# Patient Record
Sex: Male | Born: 1975 | Race: White | Hispanic: No | Marital: Married | Smoking: Former smoker
Health system: Southern US, Community
[De-identification: ages and names within clinical notes are randomized; demographics above are authoritative.]

## PROBLEM LIST (undated history)

## (undated) DIAGNOSIS — I10 Essential (primary) hypertension: Secondary | ICD-10-CM

## (undated) DIAGNOSIS — M792 Neuralgia and neuritis, unspecified: Secondary | ICD-10-CM

## (undated) DIAGNOSIS — J449 Chronic obstructive pulmonary disease, unspecified: Secondary | ICD-10-CM

## (undated) DIAGNOSIS — F329 Major depressive disorder, single episode, unspecified: Secondary | ICD-10-CM

## (undated) DIAGNOSIS — F419 Anxiety disorder, unspecified: Secondary | ICD-10-CM

## (undated) DIAGNOSIS — F32A Depression, unspecified: Secondary | ICD-10-CM

## (undated) DIAGNOSIS — M069 Rheumatoid arthritis, unspecified: Secondary | ICD-10-CM

## (undated) HISTORY — PX: WISDOM TOOTH EXTRACTION: SHX21

## (undated) HISTORY — DX: Anxiety disorder, unspecified: F41.9

## (undated) HISTORY — DX: Chronic obstructive pulmonary disease, unspecified: J44.9

## (undated) HISTORY — DX: Essential (primary) hypertension: I10

## (undated) HISTORY — DX: Rheumatoid arthritis, unspecified: M06.9

---

## 2004-06-24 ENCOUNTER — Ambulatory Visit (HOSPITAL_COMMUNITY): Admission: RE | Admit: 2004-06-24 | Discharge: 2004-06-24 | Payer: Self-pay | Admitting: Family Medicine

## 2007-10-02 ENCOUNTER — Inpatient Hospital Stay (HOSPITAL_COMMUNITY): Admission: EM | Admit: 2007-10-02 | Discharge: 2007-10-05 | Payer: Self-pay | Admitting: Emergency Medicine

## 2007-10-02 ENCOUNTER — Ambulatory Visit: Payer: Self-pay | Admitting: Internal Medicine

## 2007-10-12 ENCOUNTER — Ambulatory Visit: Payer: Self-pay | Admitting: *Deleted

## 2007-10-12 ENCOUNTER — Encounter (INDEPENDENT_AMBULATORY_CARE_PROVIDER_SITE_OTHER): Payer: Self-pay | Admitting: Internal Medicine

## 2007-10-12 DIAGNOSIS — D751 Secondary polycythemia: Secondary | ICD-10-CM

## 2007-10-12 DIAGNOSIS — F29 Unspecified psychosis not due to a substance or known physiological condition: Secondary | ICD-10-CM | POA: Insufficient documentation

## 2007-10-12 LAB — CONVERTED CEMR LAB
Albumin: 3.8 g/dL (ref 3.5–5.2)
CO2: 31 meq/L (ref 19–32)
Calcium: 9.6 mg/dL (ref 8.4–10.5)
Chloride: 98 meq/L (ref 96–112)
Glucose, Bld: 96 mg/dL (ref 70–99)
MCV: 102.8 fL — ABNORMAL HIGH (ref 78.0–100.0)
Potassium: 4.6 meq/L (ref 3.5–5.3)
RBC: 5.34 M/uL (ref 4.22–5.81)
Sodium: 137 meq/L (ref 135–145)
Total Protein: 6.3 g/dL (ref 6.0–8.3)
WBC: 6.3 10*3/uL (ref 4.0–10.5)

## 2007-10-13 DIAGNOSIS — E538 Deficiency of other specified B group vitamins: Secondary | ICD-10-CM

## 2007-11-04 ENCOUNTER — Telehealth: Payer: Self-pay | Admitting: *Deleted

## 2007-11-09 ENCOUNTER — Encounter (INDEPENDENT_AMBULATORY_CARE_PROVIDER_SITE_OTHER): Payer: Self-pay | Admitting: Internal Medicine

## 2007-11-09 ENCOUNTER — Ambulatory Visit: Payer: Self-pay | Admitting: Internal Medicine

## 2007-11-09 LAB — CONVERTED CEMR LAB
RBC: 4.7 M/uL (ref 4.22–5.81)
WBC: 10.3 10*3/uL (ref 4.0–10.5)

## 2010-07-02 NOTE — Discharge Summary (Signed)
NAMEJANARD, Ethan Norton NO.:  [id number]   MEDICAL RECORD NO.:  [id number]          PATIENT TYPE:  INP   LOCATION:  4702                         FACILITY:  [facility]   PHYSICIAN:  Mick Sell, MD DATE OF BIRTH:  1975-02-21   DATE OF ADMISSION:  10/02/2007  DATE OF DISCHARGE:  10/05/2007                               DISCHARGE SUMMARY   DISCHARGE DIAGNOSES:  Main:  Confusion/panic attacks.  Acute:  1. Erythrocytosis.  2. Ethanol abuse.  Chronic:  1. Tobacco abuse.  2. Possible chronic obstructive pulmonary disease.   CONDITION AT DISCHARGE:  Stable.   FOLLOWUP:  To include CBC to evaluate erythrocytosis and CMET to  evaluate elevated bilirubin.  The patient is to follow up with Dr.  Elvera Lennox at [facility], with an appointment scheduled for  October 12, 2007, at 2:50 p.m.   PROCEDURES:  The patient had a CT of the head without contrast on October 02, 2007, that showed a normal unenhanced cranial CT and no focal  abnormalities.  The patient had a chest x-ray on October 02, 2007, that  showed mild diffuse interstitial lung disease, which has progressed  since May 2006 and is likely directly related to the long-time smoking  history.  No acute cardiopulmonary disease.  The patient also had an MR  of the brain without contrast and an MRA of the head with contrast.  The  MRI of the brain without contrast on October 02, 2007, had the findings  of a negative intracranial exam and normal non-contrast MRI appearance  of the brain.  The MRA of the brain without contrast showed a negative  intracranial MRA.   CONSULTATIONS:  Antonietta Breach, MD, Psychiatry, was consulted.   ADMISSION HISTORY AND PHYSICAL:  The patient is a 35 year old male with  significant alcohol use, a family history of Huntington disease in  father, and smoking history who presents with 1 day of confusion/panic  attack/odd emotions that began at work on the day prior to admission,  resolved upon getting home later that night, and then woke him up around  2 a.m. on the day of admission.  Wife reports the patient acting  confused and talking in his sleep.  The patient reports infrequent dizzy  spells with vision closing in but no syncope.  He endorses diaphoresis  when he had the panic attack earlier.  He also has infrequent panic  attacks.  He says that he has had frequent feelings of helplessness and  sadness recently but no suicidal or homicidal ideations.  The patient  has had frequent restlessness over the last week.   PHYSICAL EXAMINATION:  VITAL SIGNS:  Temperature of 97.9, blood pressure  of 154/100, pulse of 116, respiratory rate of 18, and oxygen saturation  95% to 98% on room air.  GENERAL:  The patient is groggy, lying in bed, and slow to answer  questions.  For some of the history, his wife answered for him because  of his reluctance or inability to answer the questions.  HEENT:  Eyes:  Pupils equal, round, and reactive to light and  accommodation.  Extraocular  muscles intact, and his conjunctivae were  injected.  NECK:  No JVD.  No bruits.  RESPIRATORY:  Diffuse expiratory wheezes and rhonchi.  No crackles.  Decreased air movement.  CARDIOVASCULAR:  Regular rate and rhythm.  No murmurs, rubs, or gallops.  GI:  Soft.  Mild tenderness to palpation in the left lower quadrant.  Nondistended.  Positive normoactive bowel sounds.  EXTREMITIES:  No cyanosis, clubbing, or edema.  SKIN:  Less than 2-second capillary refill.  NEURO:  Cranial nerves II through XII intact.  5/5 bilateral lower and  upper extremity strength.  No asterixis.  Sensation to light touch  intact bilaterally distally and proximally.  Deep tendon reflexes 2/4  symmetrically, and cerebellar function intact.   LABORATORY DATA:  His admission BMET was sodium of 135, potassium of  4.0, chloride of 98, bicarbonate 25, BUN of 4, creatinine of 0.81, and  glucose of 100.  His liver function  tests were as follows:  Bilirubin of  2.7, awaiting fractionation still; alkaline phosphatase 85, AST of 25,  ALT of 14, total protein of 7.0, albumin of 4.1, and calcium of 9.7.  His CBC was as follows:  White cell count of 8.7, hemoglobin of 21.1,  hematocrit of 61.2, platelets of 143, ANC of 6.3, and MCV of 100.  His  ethanol level is less than 5.  His UA had a specific gravity of 1.004,  pH of 6.5, negative otherwise.  His UDS was negative.  His cardiac  enzymes x1 were negative.   HOSPITAL COURSE:  1. Confusion/panic attacks.  The patient has had infrequent panic      attacks over the last couple of weeks to months and was having one      earlier at work on the day prior to admission and then had some      confusion with odd emotions that occurred that night and so his      wife brought him to the emergency room.  In the ED, it was      difficult to elicit a history from the patient secondary to the      patient's inability or reluctance to answer questions.  Due to the      acute onset of his symptoms, a head CT without contrast was      performed, which was negative.  A followup MRI was also performed,      which was normal.  On the second day of admission, he began to have      improvement during the day.  The second night of admission,      however, the nurse noted possible hallucination that the patient      was having in the middle of the night, and the night float team was      called, which gave him 2 mg of Ativan which calmed him down and      seemed to quell the hallucinations.  Because of his behavior, the      psychiatrist Dr. Jeanie Sewer was consulted.  In his assessment,  he commented that there may be a multifactorial delirium that occurred  the night prior and that there is likely an underlying depression.  There could also be an anxiety disorder.  He recommended the patient  start on Celexa 10 mg for the first 4 days and then 20 mg after that as  his initial trail  dose for an anxiolytic.  He also recommended the use  of Xanax 0.5 mg to  1 mg for panic attacks if the patient did not drink  any alcohol.  The initial differential for the patient's behavior  included CVA, which was ruled out with the MRI; ethanol withdrawal  symptoms, which seem less likely given that he did not have any physical  signs of withdrawal, and early onset of Huntington disease given the  presence of this disease in his father and his own lack of genetic  testing.  Intoxication, infection, hypothyroidism, metabolic or  electrolyte derangements, or pheochromocytoma were also considered.  His  UDS was negative.  He did not have a leukocytosis and was afebrile, and  his TSH came back at 0.748.  Given the rest of his workup was normal, it  seems most likely that the patient's mental state does not have a  definitive organic etiology and most likely stems from a psychiatric  disorder.  An RPR and HIV were also ordered, which were nonreactive, and  the patient had a B12 level of 378.  A  methylmalonic acid level for the  patient is still pending.  1. Polycythemia.  The patient's initial CBC had a hemoglobin of 21.1      and a hematocrit of 61.2.  A followup CBC later that day had a      hemoglobin of 18.7 and a hematocrit of 54.9, on October 02, 2007.      He had received IV fluids in the interim between these 2 CBCs.  His      discharge hemoglobin was 17.4 and a hematocrit of 50.4.  He had      reported being slightly dehydrated during the day prior to      admission, and this may have contributed to his elevated hemoglobin      secondary to the decreased volume.  We performed a erythropoietin      level, which was low at 2.2.  Our initial differential for the      polycythemia included chronic hypoxia secondary to smoking,      polycythemia vera, other primary bone-marrow disorders, and      idiopathic erythrocytosis.  We will follow up on his polycythemia      as an outpatient  and consider further workup.  At this point, it      seems like the chronic hypoxia secondary to smoking along with      potential idiopathic erythrocytosis contributed to his persistently      elevated hemoglobin level.  His initial dehydration likely      contributed to the significantly elevated hemoglobin at      presentation of 21.1.  Given his erythrocytosis, we thought it      added to the risk of a potential CVA, which was another reason for      the MRI.  2. Elevated bilirubin.  His total bilirubin on admission was 2.7 on      October 02, 2007, and a followup bilirubin level was 2.5 later that      day on October 02, 2007.  Fractionation of the bilirubin had been      ordered but has not come back yet at this time.  We request that      repeat CMP with potential fractionation of the bilirubin be      repeated.  3. Ethanol abuse.  The patient reported that he drinks up to 6 beers      pretty much every day and had been doing so up until 2 days prior  to admission.  He denied ever having withdrawal seizures or      delirium.  However, according to his wife, he has experienced      withdrawal symptoms including tremors.  For this reason, he was put      on CIWA protocol for monitoring, but this did not result in      symptoms elevated enough to require a benzodiazepine.  He received      alcohol cessation counseling during his hospitalization, and it was      recommended that he discontinue alcohol use completely as this may      have been contributing to his chief complaint.  4. Depression. The patient reported feelings of helplessness and      sadness on admission but denied any homicidal or suicidal ideation.      He received a psychiatric evaluation from Dr. Jeanie Sewer, and we are      starting the patient on Celexa 10 mg for the first 4 days and then      20 mg afterwards as an outpatient.  5. Tobacco abuse.  The patient had quit smoking about a month ago but      has been  using smokeless inhalers with [facility] Original Smoke      Juice for the past few weeks.  He was put on a nicotine patch      during the hospitalization.  Tobacco cessation was encouraged      through counseling.  He was prescribed nicotine patches as an      outpatient, receiving the #21 patch for 7 days, the #14 patch for 7      days, and then the #7 patch for the last 7 days.  6. A family history of Huntington disease.  The patient is well aware      of his risk of having Huntington disease but has refused genetic      testing in the past.  He was encouraged to pursue this as an      outpatient.   DISCHARGE LABORATORIES:  His last CBC on October 05, 2007, had a  hemoglobin of 17.4, hematocrit of 50.4, MCV of 100.6, RDW of 14.5, and  platelet count of 132.  His last BMET had a sodium of 142, a potassium  of 3.5, chloride of 108, bicarbonate of 29, glucose of 97, creatinine of  0.66, and calcium of 8.1.  He also had a hemoglobin binding study on  October 03, 2007, which showed a total hemoglobin of 18.1, oxygen  saturation at 98.6, carboxyhemoglobin at 1.1, and methemoglobin of 0.9.   PENDING LABORATORIES:  The fractionation of bilirubin is pending at this  time.  The patient's methylmalonic acid level is also pending.      Linward Foster, MD  Electronically Signed      Mick Sell, MD  Electronically Signed   LW/MEDQ  D:  10/05/2007  T:  10/06/2007  Job:  [id number]   cc:   Carlus Pavlov, M.D.  Antonietta Breach, M.D.  Outpatient Clinic

## 2010-07-02 NOTE — Consult Note (Signed)
NAMECAYSON, KALB NO.:  [id number]   MEDICAL RECORD NO.:  [id number]          PATIENT TYPE:  INP   LOCATION:  4702                         FACILITY:  [facility]   PHYSICIAN:  Antonietta Breach, M.D.  DATE OF BIRTH:  19-Oct-1975   DATE OF CONSULTATION:  10/04/2007  DATE OF DISCHARGE:                                 CONSULTATION   REFERRING PHYSICIAN:  Mick Sell, MD   REASON FOR CONSULTATION:  Psychosis, anxiety, rule out delirium.   HISTORY OF PRESENT ILLNESS:  Mr. Cantera is a 35 year old male admitted  to the [facility] on October 02, 2007 due to confusion.   Mr. Geraci requested that his wife attend the session in order to help  with history.  Mr. Loomer describes a 4-week period of increased  anxiety over work and rental house.  Two weeks ago he began to drink  heavily, at least 6 beers a day.  He was having regular insomnia.  He  was not drinking or eating well (regarding nonalcoholic drinks).   He noticed that he began to have some unclear mental status.  His wife  noticed that he was talking out of his head at times over the past  approximate 7-10 days.   This became worse.  The patient became grossly confused, which forced  his wife to get him to the hospital.   Mr. Molstad has been receiving Ativan 2 mg q.6 h. p.r.n.  He had his  last alcohol 2 days prior to admission.   He was having paranoid delusions and clouding of consciousness.   On the day of the undersigned visiting, Mr. Deupree has intact  orientation.  He has intact memory function.  His thought process is  coherent.  He is not having any hallucinations or delusions.  He has no  thoughts of harming himself or others.   Mr. Overholser does describe ongoing excessive worry and feeling on edge.  He also describes short 5-10 minute periods over the past month,  where  he has had shortness of breath, palpitations and anxiety.  He has had  fear of other attacks returning.   He acknowledges that he has been using  alcohol to calm his anxiety.   PAST PSYCHIATRIC HISTORY:  Mr. Hanrahan does have a history of excessive  worry and feeling on edge.  He has never had a period of the panic  attacks such as the past 4 weeks.  He also has never had a period of  confusion and delusions as he has had this past week.   His wife states that today he has been his normal self again.   FAMILY PSYCHIATRIC HISTORY:  Mr. Voorhis father had Huntington's  disease and died 2 years ago.  He has also had another family death in  the past year.   SOCIAL HISTORY:  Mr. Leonhardt is married.  They have a mutually  supportive marriage, 2 children.  They both work for the [employer] on  different shifts.  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   Mr. Deason denies any illegal  drugs.  There is no abuse in the  marriage.  They live together in a home with their children.   PAST MEDICAL HISTORY:  Usual childhood illnesses.   MEDICATIONS:  The MAR is reviewed.  Mr. Zinni is on folic acid 1 mg  daily, B-1 100 mg daily, Ativan 2 mg q.6 h. p.r.n.   ALLERGIES:  __________.   LABORATORY DATA:  MRI without contrast of the brain, unremarkable.  Head  CT without contrast, unremarkable.   WBC 6.2, hemoglobin 17.9, platelet count 126, SGOT 21, SGPT 12, sodium  141, BUN 61, creatinine 0.67, glucose 112.   Folic acid, HIV, RPR, B12, TSH, urine drug screen and alcohol all  negative.   REVIEW OF SYSTEMS:  CONSTITUTIONAL:  HEAD, EYES, EARS, NOSE AND THROAT,  MOUTH, NEUROLOGIC, PSYCHIATRIC, CARDIOVASCULAR, RESPIRATORY,  GASTROINTESTINAL, GENITOURINARY, SKIN,  MUSCULOSKELETAL, HEMATOLOGIC  ,LYMPHATIC, ENDOCRINE, METABOLIC:  All unremarkable.   PHYSICAL EXAMINATION:  VITAL SIGNS:  Temperature 97.2, pulse 92,  respiratory rate 20, blood pressure 144/98, O2 saturation on room air  96%.  GENERAL APPEARANCE:  Mr. Hemann is alert.  His attention span is   within normal limits.  His eye contact is good.  He is oriented to all  spheres.  MOOD:  Slightly anxious.  AFFECT:  Mildly anxious.  CONCENTRATION:  Slightly decreased.  MEMORY:  Intact to immediate recent and remote except for some of the  confusion.  SPEECH:  Involves normal rate and prosody.  There is no dysarthria.  THOUGHT PROCESS:  Logical, coherent, goal-directed.  No looseness of  associations.  THOUGHT CONTENT:  No thoughts of harming himself.  No thoughts of  harming others.  No delusions, no hallucinations.  Fund of knowledge and  intelligence within normal limits.  Insight is intact.  Judgment is  intact.   ASSESSMENT:  AXIS I:  293.84.  Anxiety disorder, not otherwise  specified.  Rule out panic disorder without agoraphobia.  293.00.  Rule  out delirium, not otherwise specified.   Mr. Dom does appear to have had a multifactorial delirium which has  been possibly due to insomnia, combined with alcohol withdrawal. He also  states that he is had a cough with chills.   Alcohol withdrawal delirium as a single entity would not explain his  delirium due to the fact that the physiologic withdrawal symptoms and  signs have not been the usual severity that is involved in alcohol  withdrawal delirium.   AXIS II:  Deferred.  AXIS III:  See past medical history.  AXIS IV:  Occupational.  AXIS V:  55.   Mr. Gerding's delirium symptoms are currently clear.  However, by the  nature of delirium, they could return.   Would continue to follow his cough and general medical status as is  currently being done.   If his delirium symptoms return, would start Seroquel 25 to 50 mg  nightly.   The undersigned provided ego supportive psychotherapy and education,  including education with the wife.   The indications, alternatives and adverse effects of Celexa and Xanax  were discussed with the patient.  He understands and would like to think  about those medications.   The  undersigned also discussed cognitive behavioral therapy.  Again Mr.  Mccollam wants to consider these therapeutic tools but not yet start  them.   The undersigned also discussed the importance of no alcohol.  Mr.  Million is ambivalent about treatment at this point.  If he agrees,  would set him up  with a chemical dependency intensive outpatient program  that can involve relapse prevention skills and standard psychotherapy,  along with psychotropic medication management.   Regarding Celexa, if he agrees, would start Celexa at 10 mg q.a.m. and  then increase over the next 4 days to 20 mg q.a.m. as his initial trial  dose for antianxiety.   Under the conditions of strictly no alcohol, would utilize Xanax 0.5 to  1 mg t.i.d. p.r.n. panic.  No driving if drowsy.   Alcoholics Anonymous materials and meetings if Mr. Ansell is deciding  that he is willing.   Would continue folic acid 1 mg daily, thiamine 100 mg daily and a  multivitamin daily indefinitely.   Over the long term, cognitive behavioral therapy combined with Celexa  can result in elimination of the anxiety and potentially elimination of  the need for Xanax.   If Mr. Montoya is adverse to taking medication, cognitive behavioral  therapy with deep breathing and progressive muscle relaxation may be  able to resolve his panic attacks alone.      Antonietta Breach, M.D.  Electronically Signed     JW/MEDQ  D:  10/04/2007  T:  10/04/2007  Job:  [id number]

## 2015-05-02 ENCOUNTER — Other Ambulatory Visit: Payer: Federal, State, Local not specified - PPO

## 2015-05-02 ENCOUNTER — Ambulatory Visit (INDEPENDENT_AMBULATORY_CARE_PROVIDER_SITE_OTHER): Payer: Federal, State, Local not specified - PPO | Admitting: Internal Medicine

## 2015-05-02 ENCOUNTER — Encounter: Payer: Self-pay | Admitting: Internal Medicine

## 2015-05-02 VITALS — BP 140/88 | HR 68 | Ht 64.5 in | Wt 108.0 lb

## 2015-05-02 DIAGNOSIS — J449 Chronic obstructive pulmonary disease, unspecified: Secondary | ICD-10-CM

## 2015-05-02 MED ORDER — BUDESONIDE-FORMOTEROL FUMARATE 160-4.5 MCG/ACT IN AERO: INHALATION_SPRAY | RESPIRATORY_TRACT | Status: DC

## 2015-05-02 NOTE — Assessment & Plan Note (Addendum)
Spirometry 08/11/13   FEV1  1.67 / FVC 3.65 ratio 46%  - alpha one screening 05/02/2015 >>> - 05/02/2015  extensive coaching HFA effectiveness =    75% > try symbicort 160 2bid   The previous pft was not apparently done p optimal rx so needs to be repeated p trial of symbicort 160 2bid as he has not perceived that he's done all that well on advair and may be a candidate for laba/lama if her remains with Group B symptoms but want to reverse any ab component first and bring him back for pfts then consider change in rx     I reviewed the Fletcher curve with the patient that basically indicates  if you quit smoking when your best day FEV1 is still well preserved (as is relatively still  the case here)  it is highly unlikely you will progress to severe disease and informed the patient there was no medication on the market that has proven to alter the curve/ its downward trajectory  or the likelihood of progression of their disease.  Therefore   maintaining abstinence is the most important aspect of his care, not choice of inhalers or for that matter, doctors.    Total time devoted to counseling  = 35/64mreview case with pt/wife discussion of options/alternatives/ personally creating in presence of pt  then going over specific  Instructions directly with the pt including how to use all of the meds but in particular covering each new medication in detail (see avs)

## 2015-05-02 NOTE — Patient Instructions (Addendum)
Please remember to go to the lab department downstairs for your tests - we will call you with the results when they are available.  Plan A = Automatic = Symbicort 160 Take 2 puffs first thing in am and then another 2 puffs about 12 hours later.    Plan B = Backup Only use your albuterol (ventolin)  as a rescue medication to be used if you can't catch your breath by resting or doing a relaxed purse lip breathing pattern.  - The less you use it, the better it will work when you need it. - Ok to use up to 2 puffs  every 4 hours if you must but call for appointment if use goes up over your usual need - Don't leave home without it !!  (think of it like the spare tire for your car)     Please schedule a follow up office visit in 6 weeks, call sooner if needed with pfts ok push back if needed

## 2015-05-02 NOTE — Progress Notes (Signed)
Subjective:    Patient ID: Ethan Norton, male    DOB: [birth year],     MRN: [id number]  HPI  36 yowm quit smoking 2015 with onset of episodic coughs/ dyspnea/wheeze starting around 2011 and on advair since 2015 self referred to pulmonary clinic 05/02/2015 .    05/02/2015 1st [facility] office visit/ Ryer Asato   Chief Complaint  Patient presents with  . Pulmonary Consult    Self referral for COPD. Pt states he went to [facility] for admission in 2015 and was told he has COPD. Pt c/o DOE, mild cough with little mucus production - clear in color. Pt c/o left sided chest tightness when SOB and occasional feels has sharp pain.   episodic flares x 5 years and need for albuterol varies but has not used in the last week prior to OV  While of advair 250 one bid  First thing in am is tough and more than one flight of steps has to stop at top  Also with his wife up and and down street up to a mile slower than wife's pace  No obvious other patterns in day to day or daytime variabilty or assoc excess/ purulent sputum or mucus plugs  or cp or chest tightness, subjective wheeze overt sinus or hb symptoms. No unusual exp hx or h/o childhood pna/ asthma or knowledge of premature birth.  Sleeping ok without nocturnal  or early am exacerbation  of respiratory  c/o's or need for noct saba. Also denies any obvious fluctuation of symptoms with weather or environmental changes or other aggravating or alleviating factors except as outlined above   Current Medications, Allergies, Complete Past Medical History, Past Surgical History, Family History, and Social History were reviewed in [employer] record.            Review of Systems  Constitutional: Negative for fever and unexpected weight change.  HENT: Negative for congestion, dental problem, ear pain, nosebleeds, postnasal drip, rhinorrhea, sinus pressure, sneezing, sore throat and trouble swallowing.   Eyes: Negative  for redness and itching.  Respiratory: Positive for cough and shortness of breath. Negative for chest tightness and wheezing.   Cardiovascular: Negative for palpitations and leg swelling.  Gastrointestinal: Negative for nausea and vomiting.  Genitourinary: Negative for dysuria.  Musculoskeletal: Negative for joint swelling.  Skin: Negative for rash.  Neurological: Positive for dizziness. Negative for headaches.  Hematological: Does not bruise/bleed easily.  Psychiatric/Behavioral: Negative for dysphoric mood. The patient is not nervous/anxious.        Objective:   Physical Exam   amb wm somber nad  Wt Readings from Last 3 Encounters:  05/02/15 108 lb (48.988 kg)  11/09/07 112 lb 4.8 oz (50.939 kg)  10/12/07 106 lb 14.4 oz (48.49 kg)    Vital signs reviewed  HEENT: nl dentition, turbinates, and oropharynx. Nl external ear canals without cough reflex   NECK :  without JVD/Nodes/TM/ nl carotid upstrokes bilaterally   LUNGS: no acc muscle use,  Nl contour chest which is clear to A and P bilaterally without cough on insp or exp maneuvers   CV:  RRR  no s3 or murmur or increase in P2, no edema   ABD:  soft and nontender with nl inspiratory excursion in the supine position. No bruits or organomegaly, bowel sounds nl  MS:  Nl gait/ ext warm without deformities, calf tenderness, cyanosis or clubbing No obvious joint restrictions   SKIN: warm and dry without lesions  NEURO:  alert, approp, nl sensorium with  no motor deficits     I personally reviewed images and agree with radiology impression as follows:  CXR:  02/23/15  The lungs are clear. The cardiomediastinal silhouette is normal in size and contour.       Assessment & Plan:

## 2015-05-04 LAB — ALPHA-1-ANTITRYPSIN: A1 ANTITRYPSIN SER: 128 mg/dL (ref 83–199)

## 2015-05-09 LAB — ALPHA-1 ANTITRYPSIN PHENOTYPE: A1 ANTITRYPSIN: 114 mg/dL (ref 83–199)

## 2015-07-12 ENCOUNTER — Other Ambulatory Visit: Payer: Self-pay | Admitting: Internal Medicine

## 2015-07-12 DIAGNOSIS — R0600 Dyspnea, unspecified: Secondary | ICD-10-CM

## 2015-07-13 ENCOUNTER — Ambulatory Visit (INDEPENDENT_AMBULATORY_CARE_PROVIDER_SITE_OTHER): Payer: Federal, State, Local not specified - PPO | Admitting: Internal Medicine

## 2015-07-13 ENCOUNTER — Encounter: Payer: Self-pay | Admitting: Internal Medicine

## 2015-07-13 VITALS — BP 126/80 | HR 72 | Ht 64.5 in | Wt 109.0 lb

## 2015-07-13 DIAGNOSIS — J449 Chronic obstructive pulmonary disease, unspecified: Secondary | ICD-10-CM | POA: Diagnosis not present

## 2015-07-13 DIAGNOSIS — R0600 Dyspnea, unspecified: Secondary | ICD-10-CM

## 2015-07-13 DIAGNOSIS — R0789 Other chest pain: Secondary | ICD-10-CM | POA: Diagnosis not present

## 2015-07-13 LAB — PULMONARY FUNCTION TEST
DL/VA % pred: 72 %
DL/VA: 3.1 ml/min/mmHg/L
DLCO UNC % PRED: 64 %
DLCO cor % pred: 61 %
DLCO cor: 15.39 ml/min/mmHg
DLCO unc: 16.04 ml/min/mmHg
FEF 25-75 POST: 1.07 L/s
FEF 25-75 PRE: 0.8 L/s
FEF2575-%CHANGE-POST: 33 %
FEF2575-%PRED-POST: 31 %
FEF2575-%PRED-PRE: 23 %
FEV1-%Change-Post: 21 %
FEV1-%PRED-PRE: 46 %
FEV1-%Pred-Post: 56 %
FEV1-POST: 1.97 L
FEV1-Pre: 1.62 L
FEV1FVC-%CHANGE-POST: 13 %
FEV1FVC-%PRED-PRE: 60 %
FEV6-%CHANGE-POST: 9 %
FEV6-%Pred-Post: 83 %
FEV6-%Pred-Pre: 76 %
FEV6-Post: 3.55 L
FEV6-Pre: 3.25 L
FEV6FVC-%Change-Post: 0 %
FEV6FVC-%Pred-Post: 102 %
FEV6FVC-%Pred-Pre: 101 %
FVC-%CHANGE-POST: 6 %
FVC-%PRED-POST: 81 %
FVC-%Pred-Pre: 76 %
FVC-PRE: 3.36 L
FVC-Post: 3.59 L
POST FEV1/FVC RATIO: 55 %
POST FEV6/FVC RATIO: 100 %
PRE FEV1/FVC RATIO: 48 %
Pre FEV6/FVC Ratio: 99 %
RV % pred: 190 %
RV: 2.95 L
TLC % pred: 104 %
TLC: 6.11 L

## 2015-07-13 MED ORDER — BUDESONIDE-FORMOTEROL FUMARATE 160-4.5 MCG/ACT IN AERO: INHALATION_SPRAY | RESPIRATORY_TRACT | Status: DC

## 2015-07-13 MED ORDER — ALBUTEROL SULFATE HFA 108 (90 BASE) MCG/ACT IN AERS: 2.0000 | INHALATION_SPRAY | Freq: Four times a day (QID) | RESPIRATORY_TRACT | Status: DC | PRN

## 2015-07-13 NOTE — Progress Notes (Signed)
Subjective:    Patient ID: Ethan Norton, male    DOB: 1975/10/14     MRN: [id number]    Brief patient profile:  40 yowm quit smoking 2015 with onset of episodic coughs/ dyspnea/wheeze starting around 2011 and on advair since 2015 self referred to pulmonary clinic 05/02/2015 .   History of Present Illness  05/02/2015 1st [facility] office visit/ Ethan Norton   Chief Complaint  Patient presents with  . Pulmonary Consult    Self referral for COPD. Pt states he went to [facility] for admission in 2015 and was told he has COPD. Pt c/o DOE, mild cough with little mucus production - clear in color. Pt c/o left sided chest tightness when SOB and occasional feels has sharp pain.   episodic flares x 5 years and need for albuterol varies but has not used in the last week prior to OV  While of advair 250 one bid  First thing in am is tough and more than one flight of steps has to stop at top  Also with his wife up and and down street up to a mile slower than wife's pace rec Plan A = Automatic = Symbicort 160 Take 2 puffs first thing in am and then another 2 puffs about 12 hours later.  Plan B = Backup Only use your albuterol (ventolin)  Please schedule a follow up office visit in 6 weeks, call sooner if needed with pfts ok push back if needed   07/13/2015  f/u ov/Ethan Norton re: GOLD II symbicort   Chief Complaint  Patient presents with  . Follow-up    PFT done today. Breathing has improved slightly. He states left side CP is unchanged. He has occ non prod cough.    LUQ pain x 5 y upright only, better with nsaids, eating lots of [place] food   Doe still = MMRC2 = can't walk a nl pace on a flat grade s sob but does fine slow and flat   Still problems first thing in am with sob > cough/ no need for saba though  No obvious day to day or daytime variability or assoc excess/ purulent sputum or mucus plugs or hemoptysis or cp or chest tightness, subjective wheeze or overt sinus or hb  symptoms. No unusual exp hx or h/o childhood pna/ asthma or knowledge of premature birth.  Sleeping ok without nocturnal  or early am exacerbation  of respiratory  c/o's or need for noct saba. Also denies any obvious fluctuation of symptoms with weather or environmental changes or other aggravating or alleviating factors except as outlined above   Current Medications, Allergies, Complete Past Medical History, Past Surgical History, Family History, and Social History were reviewed in [employer] record.  ROS  The following are not active complaints unless bolded sore throat, dysphagia, dental problems, itching, sneezing,  nasal congestion or excess/ purulent secretions, ear ache,   fever, chills, sweats, unintended wt loss, classically pleuritic or exertional cp,  orthopnea pnd or leg swelling, presyncope, palpitations, abdominal pain, anorexia, nausea, vomiting, diarrhea  or change in bowel or bladder habits, change in stools or urine, dysuria,hematuria,  rash, arthralgias, visual complaints, headache, numbness, weakness or ataxia or problems with walking or coordination,  change in mood/affect or memory.                 Objective:   Physical Exam   amb wm somber nad  07/13/2015        109  05/02/15 108 lb (48.988 kg)  11/09/07 112 lb 4.8 oz (50.939 kg)  10/12/07 106 lb 14.4 oz (48.49 kg)    Vital signs reviewed  HEENT: nl dentition, turbinates, and oropharynx. Nl external ear canals without cough reflex   NECK :  without JVD/Nodes/TM/ nl carotid upstrokes bilaterally   LUNGS: no acc muscle use,  Nl contour chest which is clear to A and P bilaterally without cough on insp or exp maneuvers   CV:  RRR  no s3 or murmur or increase in P2, no edema   ABD:  soft and nontender with nl inspiratory excursion in the supine position. No bruits or organomegaly, bowel sounds nl  MS:  Nl gait/ ext warm without deformities, calf tenderness, cyanosis or clubbing No  obvious joint restrictions   SKIN: warm and dry without lesions    NEURO:  alert, approp, nl sensorium with  no motor deficits     I personally reviewed images and agree with radiology impression as follows:  CXR:  02/23/15  The lungs are clear. The cardiomediastinal silhouette is normal in size and contour.       Assessment & Plan:

## 2015-07-13 NOTE — Patient Instructions (Signed)
Plan A = Automatic = Symbicort 160 x 2 puff each am followed by Spriiva x 2 pffs Then repeat symbicort x 2 pffs x 12 hours later   Work on inhaler technique:  relax and gently blow all the way out then take a nice smooth deep breath back in, triggering the inhaler at same time you start breathing in.  Hold for up to 5 seconds if you can. Blow out thru nose. Rinse and gargle with water when done      Plan B = Backup Only use your albuterol (ventiolin) as a rescue medication to be used if you can't catch your breath by resting or doing a relaxed purse lip breathing pattern.  - The less you use it, the better it will work when you need it. - Ok to use the inhaler up to 2 puffs  every 4 hours if you must but call for appointment if use goes up over your usual need - Don't leave home without it !!  (think of it like the spare tire for your car)   Please schedule a follow up visit in 3 months but call sooner if needed

## 2015-07-13 NOTE — Assessment & Plan Note (Signed)
Quit smoking 2015 Spirometry 08/11/13   FEV1  1.67 / FVC 3.65 ratio 46%  05/02/2015  extensive coaching HFA effectiveness =    75% > try symbicort 160 2bid  - alpha one screening 05/02/2015 >  MM, levels ok - PFT's  07/13/2015  FEV1 1.97 (56 % ) ratio 55  p 21 % improvement from saba p symb 160 x2 in am prior to study with DLCO  64/61 % corrects to 72 % for alv volume  - 07/13/2015  After extensive coaching HFA effectiveness =    90%  > add trial of spiriva respimat  I had an extended discussion with the patient reviewing all relevant studies completed to date and  lasting 15 to 20 minutes of a 25 minute visit    1) 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    In meantime we can always provide samples so the patient never runs out of any needed respiratory medications.   3) flma papers completed    4) Each maintenance medication was reviewed in detail including most importantly the difference between maintenance and prns and under what circumstances the prns are to be triggered using an action plan format that is not reflected in the computer generated alphabetically organized AVS.    Please see instructions for details which were reviewed in writing and the patient given a copy highlighting the part that I personally wrote and discussed at today's ov.

## 2015-07-13 NOTE — Progress Notes (Signed)
PFT done today. 

## 2015-07-26 ENCOUNTER — Telehealth: Payer: Self-pay | Admitting: Internal Medicine

## 2015-07-26 NOTE — Telephone Encounter (Signed)
Routed to [place] for follow up.

## 2015-07-27 NOTE — Telephone Encounter (Signed)
Forms sent to [employer] since these are FMLA forms

## 2015-07-31 ENCOUNTER — Telehealth: Payer: Self-pay | Admitting: Internal Medicine

## 2015-07-31 NOTE — Telephone Encounter (Signed)
The number provided has calling restrictions  Called and spoke with the pt and notified that his forms were sent to [employer] and gave him the number to call  He denied any questions or further needs

## 2015-08-24 ENCOUNTER — Encounter: Payer: Self-pay | Admitting: *Deleted

## 2015-08-24 ENCOUNTER — Encounter: Payer: Self-pay | Admitting: Internal Medicine

## 2015-08-24 ENCOUNTER — Ambulatory Visit (INDEPENDENT_AMBULATORY_CARE_PROVIDER_SITE_OTHER): Payer: Federal, State, Local not specified - PPO | Admitting: Internal Medicine

## 2015-08-24 VITALS — BP 120/80 | HR 71 | Ht 64.5 in | Wt 109.0 lb

## 2015-08-24 DIAGNOSIS — J449 Chronic obstructive pulmonary disease, unspecified: Secondary | ICD-10-CM

## 2015-08-24 MED ORDER — GLYCOPYRROLATE-FORMOTEROL 9-4.8 MCG/ACT IN AERO: 2.0000 | INHALATION_SPRAY | Freq: Two times a day (BID) | RESPIRATORY_TRACT | Status: DC

## 2015-08-24 NOTE — Progress Notes (Signed)
Subjective:    Patient ID: Ethan Norton, male    DOB: 1976/01/17     MRN: [id number]    Brief patient profile:  40 yowm quit smoking 2015 with onset of episodic cough/dyspnea/wheeze starting around 2011 and on advair since 2015 self referred to pulmonary clinic 05/02/2015 .   History of Present Illness  05/02/2015 1st [facility] [place] [facility] office visit/ Nagi Furio   Chief Complaint  Patient presents with  . Pulmonary Consult    Self referral for COPD. Pt states he went to [facility] for admission in 2015 and was told he has COPD. Pt c/o DOE, mild cough with little mucus production - clear in color. Pt c/o left sided chest tightness when SOB and occasional feels has sharp pain.   episodic flares x 5 years and need for albuterol varies but has not used in the last week prior to OV  While of advair 250 one bid  First thing in am is tough and more than one flight of steps has to stop at top  Also with his wife up and and down street up to a mile slower than wife's pace rec Plan A = Automatic = Symbicort 160 Take 2 puffs first thing in am and then another 2 puffs about 12 hours later.  Plan B = Backup Only use your albuterol (ventolin)  Please schedule a follow up office visit in 6 weeks, call sooner if needed with pfts ok push back if needed   07/13/2015  f/u ov/Liann Spaeth re: GOLD II symbicort   Chief Complaint  Patient presents with  . Follow-up    PFT done today. Breathing has improved slightly. He states left side CP is unchanged. He has occ non prod cough.    LUQ pain x 5 y upright only, better with nsaids, eating lots of [place] food  Doe still = MMRC2 = can't walk a nl pace on a flat grade s sob but does fine slow and flat   Still problems first thing in am with sob > cough/ no need for saba though rec Plan A = Automatic = Symbicort 160 x 2 puff each am followed by Spriiva x 2 pffs Then repeat symbicort x 2 pffs x 12 hours later  Plan B = Backup Only use your albuterol  (ventiolin)  Please schedule a follow up visit in 3 months but call sooner if needed     08/24/2015  Acute extended  ov/Jaylin Benzel re:  Worse sob s cough assoc with fatigue and veritgo  Chief Complaint  Patient presents with  . Acute Visit    Pt states "having problems with chronic fatigue" also increased DOE x 3 wks.   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 No unusual exp hx or h/o childhood pna/ asthma or knowledge of premature birth.  Sleeping ok without nocturnal  or early am exacerbation  of respiratory  c/o's or need for noct saba. Also denies any obvious fluctuation  of symptoms with weather or environmental changes or other aggravating or alleviating factors except as outlined above   Current Medications, Allergies, Complete Past Medical History, Past Surgical History, Family History, and Social History were reviewed in [employer] record.  ROS  The following are not active complaints unless bolded sore throat, dysphagia, dental problems, itching, sneezing,  nasal congestion or excess/ purulent secretions, ear ache,   fever, chills, sweats, unintended wt loss, classically pleuritic or exertional cp,  orthopnea pnd or leg swelling, presyncope, palpitations, abdominal pain, anorexia, nausea, vomiting, diarrhea  or change in bowel or bladder habits, change in stools or urine, dysuria,hematuria,  rash, arthralgias, visual complaints, headache, numbness, weakness or ataxia or problems with walking or coordination,  change in  mood/affect or memory. Fatigue vertigo                 Objective:   Physical Exam   amb wm somber nad   08/24/2015          109  07/13/2015        109   05/02/15 108 lb (48.988 kg)  11/09/07 112 lb 4.8 oz (50.939 kg)  10/12/07 106 lb 14.4 oz (48.49 kg)    Vital signs reviewed  HEENT: nl dentition, turbinates, and oropharynx. Nl external ear canals without cough reflex   NECK :  without JVD/Nodes/TM/ nl carotid upstrokes bilaterally   LUNGS: no acc muscle use,  Nl contour chest which is clear to A and P bilaterally without cough on insp or exp maneuvers   CV:  RRR  no s3 or murmur or increase in P2, no edema   ABD:  soft and nontender with nl inspiratory excursion in the supine position. No bruits or organomegaly, bowel sounds nl  MS:  Nl gait/ ext warm without deformities, calf tenderness, cyanosis or clubbing No obvious joint restrictions   SKIN: warm and dry without lesions    NEURO:  alert, approp, nl sensorium with  no motor deficits           Assessment & Plan:

## 2015-08-24 NOTE — Patient Instructions (Signed)
Plan A = Automatic = try change symbicort to bevespi Take 2 puffs first thing in am and then another 2 puffs about 12 hours later.      Plan B = Backup Only use your albuterol as a rescue medication to be used if you can't catch your breath by resting or doing a relaxed purse lip breathing pattern.  - The less you use it, the better it will work when you need it. - Ok to use the inhaler up to 2 puffs  every 4 hours if you must but call for appointment if use goes up over your usual need - Don't leave home without it !!  (think of it like the spare tire for your car)

## 2015-08-24 NOTE — Assessment & Plan Note (Signed)
Quit smoking 2015 Spirometry 08/11/13   FEV1  1.67 / FVC 3.65 ratio 46%  05/02/2015  extensive coaching HFA effectiveness =    75% > try symbicort 160 2bid  - alpha one screening 05/02/2015 >  MM, levels ok - PFT's  07/13/2015  FEV1 1.97 (56 % ) ratio 55  p 21 % improvement from saba p symb 160 x2 in am prior to study with DLCO  64/61 % corrects to 72 % for alv volume  - 07/13/2015  add trial of spiriva respimat > no improvement 08/24/2015  After extensive coaching HFA effectiveness =    90% try change to bevespi  He has classic GROUP B symtoms so is probably better served by lama/laba if we can fine one he perceives as helpful > bevespi is free with commercial insurance so should give it a full 4 week trial  I had an extended discussion with the patient reviewing all relevant studies completed to date and  lasting 25 minutes of a 40  minute  Acute  visit    Concerned also re fatigue but already w/u per PC > Follow up per Primary Care planned  ? depression/ anxiety issues spilling over into pulmonary complaints  Each maintenance medication was reviewed in detail including most importantly the difference between maintenance and prns and under what circumstances the prns are to be triggered using an action plan format that is not reflected in the computer generated alphabetically organized AVS.    Please see instructions for details which were reviewed in writing and the patient given a copy highlighting the part that I personally wrote and discussed at today's ov.

## 2015-10-23 ENCOUNTER — Ambulatory Visit (INDEPENDENT_AMBULATORY_CARE_PROVIDER_SITE_OTHER): Payer: Federal, State, Local not specified - PPO | Admitting: Internal Medicine

## 2015-10-23 ENCOUNTER — Ambulatory Visit (INDEPENDENT_AMBULATORY_CARE_PROVIDER_SITE_OTHER)
Admission: RE | Admit: 2015-10-23 | Discharge: 2015-10-23 | Disposition: A | Discharge status: Home / Self Care | Payer: Federal, State, Local not specified - PPO | Source: Ambulatory Visit | Attending: Internal Medicine | Admitting: Internal Medicine

## 2015-10-23 ENCOUNTER — Encounter: Payer: Self-pay | Admitting: Internal Medicine

## 2015-10-23 VITALS — BP 144/80 | HR 95 | Temp 98.4°F | Ht 64.0 in | Wt 108.8 lb

## 2015-10-23 DIAGNOSIS — J449 Chronic obstructive pulmonary disease, unspecified: Secondary | ICD-10-CM

## 2015-10-23 DIAGNOSIS — R0789 Other chest pain: Secondary | ICD-10-CM | POA: Diagnosis not present

## 2015-10-23 MED ORDER — PREDNISONE 10 MG PO TABS: ORAL_TABLET | ORAL | 0 refills | Status: DC

## 2015-10-23 NOTE — Progress Notes (Signed)
Subjective:    Patient ID: Ethan Norton, male    DOB: 1975/03/03     MRN: [id number]    Brief patient profile:  40 yowm quit smoking 2015 with onset of episodic cough/dyspnea/wheeze starting around 2011 and on advair since 2015 self referred to pulmonary clinic 05/02/2015 .   History of Present Illness  05/02/2015 1st  Pulmonary office visit/ [name]   Chief Complaint  Patient presents with  . Pulmonary Consult    Self referral for COPD. Pt states he went to [facility] for admission in 2015 and was told he has COPD. Pt c/o DOE, mild cough with little mucus production - clear in color. Pt c/o left sided chest tightness when SOB and occasional feels has sharp pain.   episodic flares x 5 years and need for albuterol varies but has not used in the last week prior to OV  While of advair 250 one bid  First thing in am is tough and more than one flight of steps has to stop at top  Also with his wife up and and down street up to a mile slower than wife's pace rec Plan A = Automatic = Symbicort 160 Take 2 puffs first thing in am and then another 2 puffs about 12 hours later.  Plan B = Backup Only use your albuterol (ventolin)  Please schedule a follow up office visit in 6 weeks, call sooner if needed with pfts ok push back if needed   07/13/2015  f/u ov/[name] re: GOLD II symbicort   Chief Complaint  Patient presents with  . Follow-up    PFT done today. Breathing has improved slightly. He states left side CP is unchanged. He has occ non prod cough.   LUQ pain x 5 y upright only, better with nsaids, eating lots of [place] food  Doe still = MMRC2 = can't walk a nl pace on a flat grade s sob but does fine slow and flat   Still problems first thing in am with sob > cough/ no need for saba though rec Plan A = Automatic = Symbicort 160 x 2 puff each am followed by Spriiva x 2 pffs Then repeat symbicort x 2 pffs x 12 hours later  Plan B = Backup Only use your albuterol  (ventiolin)  Please schedule a follow up visit in 3 months but call sooner if needed     08/24/2015  Acute extended  ov/[name] re:  Worse sob s cough assoc with fatigue and veritgo  Chief Complaint  Patient presents with  . Acute Visit    Pt states "having problems with chronic fatigue" also increased DOE x 3 wks.   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  Plan B = Backup Only use your albuterol as a rescue medication     10/23/2015  f/u ov/[name] re: GOLD II copd / bevespi maint rx  Chief Complaint  Patient presents with  . Follow-up    Pt c/o increased  SOB and cough for the past wk. Cough is prod with clear sputum.  He is using albuterol inhaler 3 x per wk on average. He states he has had CP and left side pain also.   tried bevesp for about a week and then changed to symb/ bevespi interchanged  L cp always worse when coughing x years = decade / always same area below L breast/ always better supine Mucus is clear / "just a cold x one week" but never uses > 2 puffs  A few times a day of  Doe = MMRC3 = can't walk 100 yards even at a slow pace at a flat grade s stopping due to sob      No obvious day to day or daytime variability or assoc purulent sputum or mucus plugs or hemoptysis  or chest tightness, subjective wheeze or overt sinus or hb symptoms. No unusual exp hx or h/o childhood pna/ asthma or knowledge of premature birth.  Sleeping ok without nocturnal  or early am exacerbation  of respiratory  c/o's or need for noct saba. Also denies any obvious fluctuation of symptoms with weather or  environmental changes or other aggravating or alleviating factors except as outlined above   Current Medications, Allergies, Complete Past Medical History, Past Surgical History, Family History, and Social History were reviewed in [employer] record.  ROS  The following are not active complaints unless bolded sore throat, dysphagia, dental problems, itching, sneezing,  nasal congestion or excess/ purulent secretions, ear ache,   fever, chills, sweats, unintended wt loss, classically   exertional cp,  orthopnea pnd or leg swelling, presyncope, palpitations, abdominal pain, anorexia, nausea, vomiting, diarrhea  or change in bowel or bladder habits, change in stools or urine, dysuria,hematuria,  rash, arthralgias, visual complaints, headache, numbness, weakness or ataxia or problems with walking or coordination,  change in mood/affect or memory.               Objective:   Physical Exam   amb wm somber nad  10/23/2015         109  08/24/2015          109  07/13/2015        109   05/02/15 108 lb (48.988 kg)  11/09/07 112 lb 4.8 oz (50.939 kg)  10/12/07 106 lb 14.4 oz (48.49 kg)    Vital signs reviewed -  Note sats 95% on RA on arrival   HEENT: nl dentition, turbinates, and oropharynx. Nl external ear canals without cough reflex   NECK :  without JVD/Nodes/TM/ nl carotid upstrokes bilaterally   LUNGS: no acc muscle use,  Nl contour chest which is clear to A and P bilaterally  With exp bilateral late rhonchi/ coughing fits   CV:  RRR  no s3 or murmur or increase in P2, no edema   ABD:  soft and nontender with nl inspiratory excursion in the supine position. No bruits or organomegaly, bowel sounds nl  MS:  Nl gait/ ext warm without deformities, calf tenderness, cyanosis or clubbing No obvious joint restrictions   SKIN: warm and dry without lesions    NEURO:  alert, approp, nl sensorium with  no motor deficits    CT chest with contrast 07/09/13 for cp negative       CXR PA and Lateral:   10/23/2015 :    I personally reviewed images and agree with radiology impression as follows:    The heart size and mediastinal contours are  within normal limits. Both lungs are clear. No pneumothorax or pleural effusion is noted. The visualized skeletal structures are unremarkable.      Assessment & Plan:

## 2015-10-23 NOTE — Assessment & Plan Note (Addendum)
Quit smoking 2015 Spirometry 08/11/13   FEV1  1.67 / FVC 3.65 ratio 46%  05/02/2015  extensive coaching HFA effectiveness =    75% > try symbicort 160 2bid  - alpha one screening 05/02/2015 >  MM, levels ok - PFT's  07/13/2015  FEV1 1.97 (56 % ) ratio 55  p 21 % improvement from saba p symb 160 x2 in am prior to study with DLCO  64/61 % corrects to 72 % for alv volume  - 07/13/2015  add trial of spiriva respimat > no improvement 08/24/2015  After extensive coaching HFA effectiveness =    90% try change to bevespi  - 10/23/2015 pt preferred symbicort > bevespi so rec resume symbicort 160 2bid  Mild flare assoc with ? [place] / no evidence bacterial process/ pna. > rx Prednisone 10 mg take  4 each am x 2 days,   2 each am x 2 days,  1 each am x 2 days and stop   I had an extended discussion with the patient reviewing all relevant studies completed to date and  lasting 15 to 20 minutes of a 25 minute visit    Each maintenance medication was reviewed in detail including most importantly the difference between maintenance and prns and under what circumstances the prns are to be triggered using an action plan format that is not reflected in the computer generated alphabetically organized AVS.    Please see instructions for details which were reviewed in writing and the patient given a copy highlighting the part that I personally wrote and discussed at today's ov.

## 2015-10-23 NOTE — Patient Instructions (Signed)
Plan A = Automatic =  Change back to symbicort 160 Take 2 puffs first thing in am and then another 2 puffs about 12 hours later.     Plan B = Backup Only use your albuterol (proair)  as a rescue medication to be used if you can't catch your breath by resting or doing a relaxed purse lip breathing pattern.  - The less you use it, the better it will work when you need it. - Ok to use the inhaler up to 2 puffs  every 4 hours if you must but call for appointment if use goes up over your usual need - Don't leave home without it !!  (think of it like the spare tire for your car)    Prednisone 10 mg take  4 each am x 2 days,   2 each am x 2 days,  1 each am x 2 days and stop   Please remember to go to the lab and x-ray department downstairs for your tests - we will call you with the results when they are available.     Classic subdiaphragmatic pain pattern suggests ibs:  Stereotypical,   very limited distribution of pain locations, daytime, not exacerbated by ex  -  worse in sitting position, associated with generalized abd bloating, not present supine due to the dome effect of the diaphragm is  canceled in that position. Frequently these patients have had multiple negative GI workups and CT scans.  Treatment consists of avoiding foods that cause gas (especially [place] food, beans boiled eggs and raw vegetables like spinach and salads)  and citrucel 1 heaping tsp twice daily with a large glass of water.    Please remember to go to the   x-ray department downstairs for your tests - we will call you with the results when they are available.  Please schedule a follow up office visit in 4 weeks, sooner if needed

## 2015-10-23 NOTE — Assessment & Plan Note (Addendum)
Onset x 2011 / c/w splenic flexure  - CTchest c contrast 07/09/13 > neg   Based on > 10 y h/o "daily LUQ cp/ some better when supine"  This is almost certainly IBS > rec citrucel/ diet/ no further w/u

## 2015-10-24 NOTE — Progress Notes (Signed)
LMTCB

## 2015-10-26 NOTE — Progress Notes (Signed)
lmtcb

## 2015-10-28 ENCOUNTER — Encounter: Payer: Self-pay | Admitting: Internal Medicine

## 2015-10-29 NOTE — Progress Notes (Signed)
lmtcb x2 

## 2015-10-31 ENCOUNTER — Encounter: Payer: Self-pay | Admitting: *Deleted

## 2015-10-31 NOTE — Progress Notes (Signed)
Letter mailed to have pt call for results

## 2015-11-05 ENCOUNTER — Telehealth: Payer: Self-pay | Admitting: Internal Medicine

## 2015-11-05 NOTE — Telephone Encounter (Signed)
Called and spoke with pt and he is aware of cxr results per MW.  Nothing further is needed.

## 2015-11-20 ENCOUNTER — Ambulatory Visit: Payer: Federal, State, Local not specified - PPO | Admitting: Internal Medicine

## 2015-12-04 ENCOUNTER — Ambulatory Visit (INDEPENDENT_AMBULATORY_CARE_PROVIDER_SITE_OTHER): Payer: Federal, State, Local not specified - PPO | Admitting: Internal Medicine

## 2015-12-04 ENCOUNTER — Encounter: Payer: Self-pay | Admitting: Internal Medicine

## 2015-12-04 VITALS — BP 162/96 | HR 68 | Ht 61.0 in | Wt 111.0 lb

## 2015-12-04 DIAGNOSIS — R0789 Other chest pain: Secondary | ICD-10-CM

## 2015-12-04 DIAGNOSIS — J449 Chronic obstructive pulmonary disease, unspecified: Secondary | ICD-10-CM | POA: Diagnosis not present

## 2015-12-04 DIAGNOSIS — I10 Essential (primary) hypertension: Secondary | ICD-10-CM

## 2015-12-04 NOTE — Progress Notes (Signed)
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 Pulmonary Consult    Self referral for COPD. Pt states he went to [facility] for admission in 2015 and was told he has COPD. Pt c/o DOE, mild cough with little mucus production - clear in color. Pt c/o left sided chest tightness when SOB and occasional feels has sharp pain.   episodic flares x 5 years and need for albuterol varies but has not used in the last week prior to OV  While of advair 250 one bid  First thing in am is tough and more than one flight of steps has to stop at top  Also with his wife up and and down street up to a mile slower than wife's pace rec Plan A = Automatic = Symbicort 160 Take 2 puffs first thing in am and then another 2 puffs about 12 hours later.  Plan B = Backup Only use your albuterol (ventolin)  Please schedule a follow up office visit in 6 weeks, call sooner if needed with pfts ok push back if needed   07/13/2015  f/u ov/Wert re: GOLD II symbicort   Chief Complaint  Patient presents with  . Follow-up    PFT done today. Breathing has improved slightly. He states left side CP is unchanged. He has occ non prod cough.   LUQ pain x 5 y upright only, better with nsaids, eating lots of [place] food  Doe still = MMRC2 = can't walk a nl pace on a flat grade s sob but does fine slow and flat   Still problems first thing in am with sob > cough/ no need for saba though rec Plan A = Automatic = Symbicort 160 x 2 puff each am followed by Spriiva x  2 pffs Then repeat symbicort x 2 pffs x 12 hours later  Plan B = Backup Only use your albuterol (ventiolin)  Please schedule a follow up visit in 3 months but call sooner if needed     08/24/2015  Acute extended  ov/Wert re:  Worse sob s cough assoc with fatigue and veritgo  Chief Complaint  Patient presents with  . Acute Visit    Pt states "having problems with chronic fatigue" also increased DOE x 3 wks.   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  Plan B = Backup Only use your albuterol as a rescue medication     10/23/2015  f/u ov/Wert re: GOLD II copd / bevespi  maint rx  Chief Complaint  Patient presents with  . Follow-up    Pt c/o increased SOB and cough for the past wk. Cough is prod with clear sputum.  He is using albuterol inhaler 3 x per wk on average. He states he has had CP and left side pain also.   tried bevesp for about a week and then changed to symb/ bevespi interchanged  L cp always worse when coughing x years = decade / always same area below L breast/ always better supine Mucus is clear / "just a cold x one week" but never uses > 2 puffs  A few times a day of  Doe = MMRC3 = can't walk 100 yards even at a slow pace at a flat grade s stopping due to sob  rec Plan A = Automatic =  Change back to symbicort 160 Take 2 puffs first thing in am and then another 2 puffs about 12 hours later.  Plan B = Backup Only use your albuterol (proair)  as a rescue medication Prednisone 10 mg take  4 each am x 2 days,   2 each am x 2 days,  1 each am x 2 days and stop   Classic  subdiaphragmatic pain pattern suggests ibs:  Stereotypical,   very limited distribution of pain locations, daytime, not exacerbated by ex  -  worse in sitting position, associated with generalized abd bloating, not present supine due to the dome effect of the diaphragm is  canceled in that position. Frequently these patients have had multiple negative GI workups and CT scans. Treatment consists of avoiding foods that cause gas (especially [place] food, beans boiled eggs and raw vegetables like spinach and salads)  and citrucel 1 heaping tsp twice daily with a large glass of water.        12/04/2015  f/u ov/Wert re: GOLD II copd/ deconditioning - LUQ quadrant pain improved on rx for IBS  Chief Complaint  Patient presents with  . Follow-up    Breathing is unchanged. His left sid pain has improved on citrucel. He is using ventolin 2-3 x per wk on average.   still doe = MMRC3    No obvious day to day or daytime variability or assoc purulent sputum or mucus plugs or hemoptysis  or chest tightness, subjective wheeze or overt sinus or hb symptoms. No unusual exp hx or h/o childhood pna/ asthma or knowledge of premature birth.  Sleeping ok without nocturnal  or early am exacerbation  of respiratory  c/o's or need for noct saba. Also denies any obvious fluctuation of symptoms with weather or environmental changes or other aggravating or alleviating factors except as outlined above   Current Medications, Allergies, Complete Past Medical History, Past Surgical History, Family History, and Social History were reviewed in [employer] record.  ROS  The following are not active complaints unless bolded sore throat, dysphagia, dental problems, itching, sneezing,  nasal congestion or excess/ purulent secretions, ear ache,   fever, chills, sweats, unintended wt loss, classically   exertional cp,  orthopnea pnd or leg swelling, presyncope, palpitations, abdominal pain, anorexia, nausea,  vomiting, diarrhea  or change in bowel or bladder habits, change in stools or urine, dysuria,hematuria,  rash, arthralgias, visual complaints, headache, numbness, weakness or ataxia or problems with walking or coordination,  change in mood/affect or memory.               Objective:   Physical Exam   amb wm  somber nad  12/04/2015      111 10/23/2015         109  08/24/2015          109  07/13/2015        109   05/02/15 108 lb (48.988 kg)  11/09/07 112 lb 4.8 oz (50.939 kg)  10/12/07 106 lb 14.4 oz (48.49 kg)    Vital signs reviewed -  Note sats 98% on RA on arrival - note hbp  HEENT: nl dentition, turbinates, and oropharynx. Nl external ear canals without cough reflex   NECK :  without JVD/Nodes/TM/ nl carotid upstrokes bilaterally   LUNGS: no acc muscle use,  Nl contour chest which is clear to A and P bilaterally  With distant bs/ min exp rhonchi no longer with cough on exp  CV:  RRR  no s3 or murmur or increase in P2, no edema   ABD:  soft and nontender with nl inspiratory excursion in the supine position. No bruits or organomegaly, bowel sounds nl  MS:  Nl gait/ ext warm without deformities, calf tenderness, cyanosis or clubbing No obvious joint restrictions   SKIN: warm and dry without lesions    NEURO:  alert, approp, nl sensorium with  no motor deficits    CT chest with contrast 07/09/13 for cp negative     CXR PA and Lateral:   10/23/2015 :    I personally reviewed images and agree with radiology impression as follows:    The heart size and mediastinal contours are within normal limits. Both lungs are clear. No pneumothorax or pleural effusion is noted. The visualized skeletal structures are unremarkable.      Assessment & Plan:

## 2015-12-04 NOTE — Assessment & Plan Note (Addendum)
Quit smoking 2015 Spirometry 08/11/13   FEV1  1.67 / FVC 3.65 ratio 46%  05/02/2015  extensive coaching HFA effectiveness =    75% > try symbicort 160 2bid  - alpha one screening 05/02/2015 >  MM, levels ok - PFT's  07/13/2015  FEV1 1.97 (56 % ) ratio 55  p 21 % improvement from saba p symb 160 x2 in am prior to study with DLCO  64/61 % corrects to 72 % for alv volume  - 07/13/2015  add trial of spiriva respimat > no improvement so d/c'd  08/24/2015  After extensive coaching HFA effectiveness =    90% try change to bevespi - 10/23/2015 pt preferred symbicort > bevespi so rec resume symbicort 160 2bid> improved 12/04/2015  - 12/04/2015 referred to rehab   Pattern is now most c/w GROUP B symptoms, risk but did not do as well on laba/lama as laba/ics suggesting there may be an AB component despite absence of convincing improvement no prednisone  rec maint on symb 160 2bid Rehab next step

## 2015-12-04 NOTE — Assessment & Plan Note (Signed)
Not ideally controlled on present rx > Follow up per Primary Care planned

## 2015-12-04 NOTE — Assessment & Plan Note (Signed)
Onset x 2011 / c/w splenic flexure  - CTchest c contrast 07/09/13 > neg  - 12/04/2015 reported better with rx for IBS > no further w/u indicated

## 2015-12-04 NOTE — Patient Instructions (Addendum)
I would strongly recommend pulmonary rehabilitation > we will set this up   Please schedule a follow up visit in 3 months but call sooner if needed and follow up with primary care re hypertension

## 2015-12-10 ENCOUNTER — Telehealth (HOSPITAL_COMMUNITY): Payer: Self-pay | Admitting: *Deleted

## 2015-12-25 ENCOUNTER — Encounter: Payer: Self-pay | Admitting: *Deleted

## 2015-12-25 ENCOUNTER — Telehealth: Payer: Self-pay | Admitting: Internal Medicine

## 2015-12-25 NOTE — Telephone Encounter (Signed)
Spoke with pt who is requesting a letter stating he his under our care as pt has been out off work since June. Pt states he would need this letter by tomorrow.  MW please advise. Thanks.

## 2015-12-25 NOTE — Telephone Encounter (Signed)
Letter created and up front for pick up  Pt aware  Nothing further needed

## 2015-12-25 NOTE — Telephone Encounter (Signed)
Ok to write letter

## 2015-12-25 NOTE — Telephone Encounter (Signed)
[phone] pt calling back

## 2016-01-10 ENCOUNTER — Emergency Department (HOSPITAL_COMMUNITY)
Admission: EM | Admit: 2016-01-10 | Discharge: 2016-01-11 | Disposition: A | Discharge status: Home / Self Care | Payer: Federal, State, Local not specified - PPO | Attending: Emergency Medicine | Admitting: Emergency Medicine

## 2016-01-10 ENCOUNTER — Emergency Department (HOSPITAL_COMMUNITY): Payer: Federal, State, Local not specified - PPO

## 2016-01-10 ENCOUNTER — Encounter (HOSPITAL_COMMUNITY): Payer: Self-pay | Admitting: Emergency Medicine

## 2016-01-10 DIAGNOSIS — Z87891 Personal history of nicotine dependence: Secondary | ICD-10-CM | POA: Insufficient documentation

## 2016-01-10 DIAGNOSIS — F919 Conduct disorder, unspecified: Secondary | ICD-10-CM | POA: Insufficient documentation

## 2016-01-10 DIAGNOSIS — J449 Chronic obstructive pulmonary disease, unspecified: Secondary | ICD-10-CM | POA: Diagnosis not present

## 2016-01-10 DIAGNOSIS — F10121 Alcohol abuse with intoxication delirium: Secondary | ICD-10-CM | POA: Diagnosis not present

## 2016-01-10 DIAGNOSIS — R4689 Other symptoms and signs involving appearance and behavior: Secondary | ICD-10-CM

## 2016-01-10 DIAGNOSIS — R451 Restlessness and agitation: Secondary | ICD-10-CM | POA: Diagnosis present

## 2016-01-10 DIAGNOSIS — I10 Essential (primary) hypertension: Secondary | ICD-10-CM | POA: Diagnosis not present

## 2016-01-10 DIAGNOSIS — Z8261 Family history of arthritis: Secondary | ICD-10-CM | POA: Diagnosis not present

## 2016-01-10 DIAGNOSIS — Z79899 Other long term (current) drug therapy: Secondary | ICD-10-CM | POA: Insufficient documentation

## 2016-01-10 DIAGNOSIS — Z801 Family history of malignant neoplasm of trachea, bronchus and lung: Secondary | ICD-10-CM | POA: Diagnosis not present

## 2016-01-10 DIAGNOSIS — E860 Dehydration: Secondary | ICD-10-CM | POA: Insufficient documentation

## 2016-01-10 DIAGNOSIS — Z5181 Encounter for therapeutic drug level monitoring: Secondary | ICD-10-CM | POA: Insufficient documentation

## 2016-01-10 DIAGNOSIS — Z8249 Family history of ischemic heart disease and other diseases of the circulatory system: Secondary | ICD-10-CM | POA: Diagnosis not present

## 2016-01-10 DIAGNOSIS — F10921 Alcohol use, unspecified with intoxication delirium: Secondary | ICD-10-CM | POA: Diagnosis present

## 2016-01-10 HISTORY — DX: Depression, unspecified: F32.A

## 2016-01-10 HISTORY — DX: Major depressive disorder, single episode, unspecified: F32.9

## 2016-01-10 HISTORY — DX: Neuralgia and neuritis, unspecified: M79.2

## 2016-01-10 LAB — CBC WITH DIFFERENTIAL/PLATELET
BASOS PCT: 0 %
Basophils Absolute: 0 10*3/uL (ref 0.0–0.1)
Eosinophils Absolute: 0.1 10*3/uL (ref 0.0–0.7)
Eosinophils Relative: 1 %
HEMATOCRIT: 49 % (ref 39.0–52.0)
Hemoglobin: 16.8 g/dL (ref 13.0–17.0)
LYMPHS ABS: 2.3 10*3/uL (ref 0.7–4.0)
LYMPHS PCT: 21 %
MCH: 34.1 pg — AB (ref 26.0–34.0)
MCHC: 34.3 g/dL (ref 30.0–36.0)
MCV: 99.6 fL (ref 78.0–100.0)
MONO ABS: 0.4 10*3/uL (ref 0.1–1.0)
MONOS PCT: 3 %
NEUTROS ABS: 8.3 10*3/uL — AB (ref 1.7–7.7)
Neutrophils Relative %: 75 %
Platelets: 241 10*3/uL (ref 150–400)
RBC: 4.92 MIL/uL (ref 4.22–5.81)
RDW: 13.1 % (ref 11.5–15.5)
WBC: 11.1 10*3/uL — ABNORMAL HIGH (ref 4.0–10.5)

## 2016-01-10 LAB — URINALYSIS, ROUTINE W REFLEX MICROSCOPIC
Bilirubin Urine: NEGATIVE
Glucose, UA: NEGATIVE mg/dL
Ketones, ur: NEGATIVE mg/dL
Leukocytes, UA: NEGATIVE
NITRITE: NEGATIVE
PROTEIN: NEGATIVE mg/dL
pH: 5.5 (ref 5.0–8.0)

## 2016-01-10 LAB — RAPID URINE DRUG SCREEN, HOSP PERFORMED
AMPHETAMINES: NOT DETECTED
Barbiturates: NOT DETECTED
Benzodiazepines: NOT DETECTED
COCAINE: NOT DETECTED
OPIATES: NOT DETECTED
TETRAHYDROCANNABINOL: NOT DETECTED

## 2016-01-10 LAB — LIPASE, BLOOD: Lipase: 31 U/L (ref 11–51)

## 2016-01-10 LAB — BASIC METABOLIC PANEL
Anion gap: 15 (ref 5–15)
BUN: 9 mg/dL (ref 6–20)
CALCIUM: 8.7 mg/dL — AB (ref 8.9–10.3)
CHLORIDE: 102 mmol/L (ref 101–111)
CO2: 21 mmol/L — AB (ref 22–32)
CREATININE: 1.05 mg/dL (ref 0.61–1.24)
GFR calc Af Amer: >60 mL/min (ref >60)
GFR calc non Af Amer: >60 mL/min (ref >60)
GLUCOSE: 92 mg/dL (ref 65–99)
Potassium: 3.9 mmol/L (ref 3.5–5.1)
Sodium: 138 mmol/L (ref 135–145)

## 2016-01-10 LAB — ETHANOL: Alcohol, Ethyl (B): 363 mg/dL (ref <5)

## 2016-01-10 LAB — ACETAMINOPHEN LEVEL

## 2016-01-10 LAB — URINE MICROSCOPIC-ADD ON: WBC UA: NONE SEEN WBC/hpf (ref 0–5)

## 2016-01-10 LAB — LACTIC ACID, PLASMA
Lactic Acid, Venous: 3.9 mmol/L (ref 0.5–1.9)
Lactic Acid, Venous: 3.2 mmol/L (ref 0.5–1.9)

## 2016-01-10 LAB — CK: Total CK: 163 U/L (ref 49–397)

## 2016-01-10 LAB — SALICYLATE LEVEL

## 2016-01-10 LAB — PROTIME-INR
INR: 1.11
Prothrombin Time: 14.3 seconds (ref 11.4–15.2)

## 2016-01-10 NOTE — ED Notes (Signed)
Patient transported to CT 

## 2016-01-10 NOTE — ED Notes (Signed)
Call from Lab Critical lactic acid Dr T informed

## 2016-01-10 NOTE — ED Notes (Signed)
CRITICAL VALUE ALERT  Critical value received:  Lactic Acid 3.2  Date of notification:  01/10/2016  Time of notification:  2315  Critical value read back: yes  Nurse who received alert:  Natividad Brood, RN  MD notified (1st page):  Dr. Sabra Heck  Time of first page:  2321  MD notified (2nd page):  Time of second page:  Responding MD:  Dr. Sabra Heck  Time MD responded:  2321

## 2016-01-10 NOTE — ED Notes (Signed)
Call from lab-  pt [id number] [id number] Dr T informed

## 2016-01-10 NOTE — ED Notes (Signed)
Physician discussing care and results with spouse of pt
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
## 2016-01-10 NOTE — ED Notes (Signed)
Family suppprt- spouse. She is apprised of repeat test and inquires whether or not she should stay and await dispo or go and retrieve her vehicle
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
## 2016-01-10 NOTE — ED Notes (Signed)
Call to lab re: lactic acid- not drawn as pt was in CT-  They will draw now

## 2016-01-10 NOTE — ED Notes (Signed)
Pt request "something with a little sugar" unable to provide this fopr patient so he is provided soda crackers and more pos
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
## 2016-01-10 NOTE — ED Notes (Signed)
Pos offered to pt

## 2016-01-10 NOTE — ED Notes (Addendum)
Spouse bringing pt food from vending machine and will then go home and take her other famly members  Her Number [phone]

## 2016-01-10 NOTE — ED Notes (Signed)
Pos offered to pt- he is agitated and uncooperative. His behaviors were marked different while his spouse was at bedside- He then, was polite and appropriate.
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
## 2016-01-11 DIAGNOSIS — Z87891 Personal history of nicotine dependence: Secondary | ICD-10-CM

## 2016-01-11 DIAGNOSIS — Z888 Allergy status to other drugs, medicaments and biological substances status: Secondary | ICD-10-CM

## 2016-01-11 DIAGNOSIS — F10921 Alcohol use, unspecified with intoxication delirium: Secondary | ICD-10-CM | POA: Diagnosis not present

## 2016-01-11 DIAGNOSIS — Z8261 Family history of arthritis: Secondary | ICD-10-CM

## 2016-01-11 DIAGNOSIS — Z801 Family history of malignant neoplasm of trachea, bronchus and lung: Secondary | ICD-10-CM

## 2016-01-11 DIAGNOSIS — Z8249 Family history of ischemic heart disease and other diseases of the circulatory system: Secondary | ICD-10-CM | POA: Diagnosis not present

## 2016-01-11 DIAGNOSIS — Z800 Family history of malignant neoplasm of digestive organs: Secondary | ICD-10-CM

## 2016-01-11 DIAGNOSIS — Z79899 Other long term (current) drug therapy: Secondary | ICD-10-CM

## 2016-01-11 LAB — I-STAT CG4 LACTIC ACID, ED: LACTIC ACID, VENOUS: 0.73 mmol/L (ref 0.5–1.9)

## 2016-01-11 LAB — CK: CK TOTAL: 1889 U/L — AB (ref 49–397)

## 2016-01-11 MED ORDER — SODIUM CHLORIDE 0.9 % IV BOLUS (SEPSIS)
2000.0000 mL | Freq: Once | INTRAVENOUS | Status: AC
  Administered 2016-01-11: 2000 mL via INTRAVENOUS

## 2016-01-11 NOTE — ED Provider Notes (Signed)
Psychiatry recommends discharge home. Patient's elevated lactic acid has resolved as well as his tachycardia. Given 2 L of IV fluids. Mild elevation in his CK but no evidence of renal insufficiency or myoglobin in the urine.   Julianne Rice, MD 01/11/16 [phone]

## 2016-01-11 NOTE — ED Notes (Signed)
Pt denying he has ever been admitted for any psychiatric episodes/issues.
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
## 2016-01-11 NOTE — ED Notes (Signed)
Pt talking to his pillow.

## 2016-01-11 NOTE — ED Notes (Signed)
Pt given water 

## 2016-01-11 NOTE — ED Notes (Signed)
Pt resting quietly at this time.  Officer remains at bedside.

## 2016-03-10 ENCOUNTER — Ambulatory Visit: Payer: Federal, State, Local not specified - PPO | Admitting: Internal Medicine

## 2016-03-24 ENCOUNTER — Ambulatory Visit (INDEPENDENT_AMBULATORY_CARE_PROVIDER_SITE_OTHER): Payer: 59 | Admitting: Internal Medicine

## 2016-03-24 ENCOUNTER — Encounter: Payer: Self-pay | Admitting: Internal Medicine

## 2016-03-24 VITALS — BP 130/86 | HR 81 | Ht 65.0 in | Wt 120.0 lb

## 2016-03-24 DIAGNOSIS — J449 Chronic obstructive pulmonary disease, unspecified: Secondary | ICD-10-CM | POA: Diagnosis not present

## 2016-03-24 MED ORDER — BUDESONIDE-FORMOTEROL FUMARATE 160-4.5 MCG/ACT IN AERO: INHALATION_SPRAY | RESPIRATORY_TRACT | 11 refills | Status: DC

## 2016-03-24 NOTE — Progress Notes (Signed)
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 Pulmonary Consult    Self referral for COPD. Pt states he went to [facility] for admission in 2015 and was told he has COPD. Pt c/o DOE, mild cough with little mucus production - clear in color. Pt c/o left sided chest tightness when SOB and occasional feels has sharp pain.   episodic flares x 5 years and need for albuterol varies but has not used in the last week prior to OV  While of advair 250 one bid  First thing in am is tough and more than one flight of steps has to stop at top  Also with his wife up and and down street up to a mile slower than wife's pace rec Plan A = Automatic = Symbicort 160 Take 2 puffs first thing in am and then another 2 puffs about 12 hours later.  Plan B = Backup Only use your albuterol (ventolin)  Please schedule a follow up office visit in 6 weeks, call sooner if needed with pfts ok push back if needed   07/13/2015  f/u ov/[name] re: GOLD II symbicort   Chief Complaint  Patient presents with  . Follow-up    PFT done today. Breathing has improved slightly. He states left side CP is unchanged. He has occ non prod cough.   LUQ pain x 5 y upright only, better with nsaids, eating lots of [place] food  Doe still = MMRC2 = can't walk a nl pace on a flat grade s sob but does fine slow and flat   Still problems first thing in am with sob > cough/ no need for saba though rec Plan A = Automatic = Symbicort 160 x 2 puff each am followed by Spriiva x  2 pffs Then repeat symbicort x 2 pffs x 12 hours later  Plan B = Backup Only use your albuterol (ventiolin)  Please schedule a follow up visit in 3 months but call sooner if needed     08/24/2015  Acute extended  ov/[name] re:  Worse sob s cough assoc with fatigue and veritgo  Chief Complaint  Patient presents with  . Acute Visit    Pt states "having problems with chronic fatigue" also increased DOE x 3 wks.   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  Plan B = Backup Only use your albuterol as a rescue medication     10/23/2015  f/u ov/[name] re: GOLD II copd / bevespi  maint rx  Chief Complaint  Patient presents with  . Follow-up    Pt c/o increased SOB and cough for the past wk. Cough is prod with clear sputum.  He is using albuterol inhaler 3 x per wk on average. He states he has had CP and left side pain also.   tried bevespi for about a week and then changed to symb/ bevespi interchanged  L cp always worse when coughing x years = decade / always same area below L breast/ always better supine Mucus is clear / "just a cold x one week" but never uses > 2 puffs  A few times a day of  Doe = MMRC3 = can't walk 100 yards even at a slow pace at a flat grade s stopping due to sob  rec Plan A = Automatic =  Change back to symbicort 160 Take 2 puffs first thing in am and then another 2 puffs about 12 hours later.  Plan B = Backup Only use your albuterol (proair)  as a rescue medication Prednisone 10 mg take  4 each am x 2 days,   2 each am x 2 days,  1 each am x 2 days and stop   Classic  subdiaphragmatic pain pattern suggests ibs:  Treatment consists of avoiding foods that cause gas (especially [place] food, beans boiled eggs and raw vegetables like spinach and salads)  and citrucel 1 heaping tsp twice daily with a large glass of water.        12/04/2015  f/u ov/[name] re: GOLD II copd/ deconditioning - LUQ quadrant pain improved on rx for IBS  Chief Complaint  Patient presents with  . Follow-up    Breathing is unchanged. His left sid pain has improved on citrucel. He is using ventolin 2-3 x per wk on average.   still doe = MMRC3  rec I would strongly recommend pulmonary rehabilitation > we will set this up> could not afford    03/24/2016  f/u ov/[name] re: GOLD II copd/ symbiocrt 160 2bid and did not think spiriva helpd  Chief Complaint  Patient presents with  . Follow-up    Breathing is unchanged. He has some cough with clear sputum.  He uses rescue inhaler 2 x per wk on average.   does stair master 3-5 x per week x 10 min x unmotorized x ? Least resistance   Insurance has changed so looking again at rehab  Some am cough/ congestion but doesn't disturb sleep/ no more cp   No obvious day to day or daytime variability or assoc excess/ purulent sputum or mucus plugs or hemoptysis or  chest tightness, subjective wheeze or overt sinus or hb symptoms. No unusual exp hx or h/o childhood pna/ asthma or knowledge of premature birth.  Sleeping ok without nocturnal  or early am exacerbation  of respiratory  c/o's or need for noct saba. Also denies any obvious fluctuation of symptoms with weather or environmental changes or other aggravating or alleviating factors except as outlined above   Current Medications, Allergies, Complete Past Medical History, Past Surgical History, Family History, and Social History were reviewed in [employer] record.  ROS  The following are not active complaints unless bolded sore throat, dysphagia, dental problems, itching,  sneezing,  nasal congestion or excess/ purulent secretions, ear ache,   fever, chills, sweats, unintended wt loss, classically pleuritic or exertional cp,  orthopnea pnd or leg swelling, presyncope, palpitations, abdominal pain, anorexia, nausea, vomiting, diarrhea  or change  in bowel or bladder habits, change in stools or urine, dysuria,hematuria,  rash, arthralgias, visual complaints, headache, numbness, weakness or ataxia or problems with walking or coordination,  change in mood/affect or memory.                     Objective:   Physical Exam   amb wm somber nad  03/24/2016         120  12/04/2015      111 10/23/2015         109  08/24/2015          109  07/13/2015        109   05/02/15 108 lb (48.988 kg)  11/09/07 112 lb 4.8 oz (50.939 kg)  10/12/07 106 lb 14.4 oz (48.49 kg)    Vital signs reviewed -  Note sats 100% on RA on arrival / bp now wnl   HEENT: nl dentition, turbinates, and oropharynx. Nl external ear canals without cough reflex   NECK :  without JVD/Nodes/TM/ nl carotid upstrokes bilaterally   LUNGS: no acc muscle use,  Nl contour chest which is clear to A and P bilaterally  With distant bs bilaterally, very distant mid to late exp rhonchi better with PLM  CV:  RRR  no s3 or murmur or increase in P2, no edema   ABD:  soft and nontender with nl inspiratory excursion in the supine position. No bruits or organomegaly, bowel sounds nl  MS:  Nl gait/ ext warm without deformities, calf tenderness, cyanosis or clubbing No obvious joint restrictions   SKIN: warm and dry without lesions    NEURO:  alert, approp, nl sensorium with  no motor deficits          Assessment & Plan:

## 2016-03-24 NOTE — Patient Instructions (Addendum)
Check out rehab affordability   To get the most out of exercise, you need to be continuously aware that you are short of breath, but never out of breath, for 30 minutes daily. As you improve, it will actually be easier for you to do the same amount of exercise  in 30 minutes so always push to the level where you are short of breath.    Please schedule a follow up visit in  6  months but call sooner if needed

## 2016-03-25 NOTE — Assessment & Plan Note (Signed)
Quit smoking 2015 Spirometry 08/11/13   FEV1  1.67 / FVC 3.65 ratio 46%  05/02/2015  extensive coaching HFA effectiveness =    75% > try symbicort 160 2bid  - alpha one screening 05/02/2015 >  MM, levels ok - PFT's  07/13/2015  FEV1 1.97 (56 % ) ratio 55  p 21 % improvement from saba p symb 160 x2 in am prior to study with DLCO  64/61 % corrects to 72 % for alv volume  - 07/13/2015  add trial of spiriva respimat > no improvement 08/24/2015  After extensive coaching HFA effectiveness =    90% try change to bevespi - 10/23/2015 pt preferred symbicort > bevespi so rec resume symbicort 160 2bid - 12/04/2015 referred to  rehab > 03/24/2016 referred again   03/24/2016  After extensive coaching HFA effectiveness =    90%> continue hfa   Continues to have symptoms out of proportion to degree of airflow obstruction that did improve with adding LAMA and I think would be best served by going ahead with rehab but no further changes in meds anticipated  I had an extended discussion with the patient reviewing all relevant studies completed to date and  lasting 15 to 20 minutes of a 25 minute visit    Each maintenance medication was reviewed in detail including most importantly the difference between maintenance and prns and under what circumstances the prns are to be triggered using an action plan format that is not reflected in the computer generated alphabetically organized AVS.    Please see AVS for specific instructions unique to this visit that I personally wrote and verbalized to the the pt in detail and then reviewed with pt  by my nurse highlighting any  changes in therapy recommended at today's visit to their plan of care.

## 2016-09-22 ENCOUNTER — Ambulatory Visit (INDEPENDENT_AMBULATORY_CARE_PROVIDER_SITE_OTHER): Payer: 59 | Admitting: Internal Medicine

## 2016-09-22 ENCOUNTER — Encounter: Payer: Self-pay | Admitting: Internal Medicine

## 2016-09-22 VITALS — BP 118/76 | HR 81 | Ht 65.0 in | Wt 121.6 lb

## 2016-09-22 DIAGNOSIS — J449 Chronic obstructive pulmonary disease, unspecified: Secondary | ICD-10-CM

## 2016-09-22 NOTE — Progress Notes (Signed)
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 Pulmonary Consult    Self referral for COPD. Pt states he went to [facility] for admission in 2015 and was told he has COPD. Pt c/o DOE, mild cough with little mucus production - clear in color. Pt c/o left sided chest tightness when SOB and occasional feels has sharp pain.   episodic flares x 5 years and need for albuterol varies but has not used in the last week prior to OV  While of advair 250 one bid  First thing in am is tough and more than one flight of steps has to stop at top  Also with his wife up and and down street up to a mile slower than wife's pace rec Plan A = Automatic = Symbicort 160 Take 2 puffs first thing in am and then another 2 puffs about 12 hours later.  Plan B = Backup Only use your albuterol (ventolin)  Please schedule a follow up office visit in 6 weeks, call sooner if needed with pfts ok push back if needed    12/04/2015  f/u ov/Ethan Norton re: GOLD II copd/ deconditioning - LUQ quadrant pain improved on rx for IBS  Chief Complaint  Patient presents with  . Follow-up    Breathing is unchanged. His left sid pain has improved on citrucel. He is using ventolin 2-3 x per wk on average.   still doe = MMRC3  rec I would strongly recommend pulmonary rehabilitation > we will set this up> could not afford     03/24/2016  f/u ov/Ethan Norton re: GOLD II copd/ symbiocrt 160 2bid and did not think spiriva helped Chief Complaint  Patient presents with  .  Follow-up    Breathing is unchanged. He has some cough with clear sputum.  He uses rescue inhaler 2 x per wk on average.   does stair master 3-5 x per week x 10 min x unmotorized x ? Least resistance   Insurance has changed so looking again at rehab  Some am cough/ congestion but doesn't disturb sleep/ no more cp  rec Check out rehab affordability  To get the most out of exercise, you need to be continuously aware that you are short of breath, but never out of breath, for 30 minutes daily.  Please schedule a follow up visit in  6  months but call sooner if needed      09/22/2016  f/u ov/Ethan Norton re:   GOLD II copd/ symb 160 2bid  Preferred over symb/spiriva and bevespi  Chief Complaint  Patient presents with  . Follow-up    Breathing is unchanged. He is using his albuterol inhaler 1-2 x per wk on average.    no noct symptoms/ no am cough Still not exercising as rec "chasing a 46 y old around the house" and declines rehab due to expense Doe = MMRC2 = can't walk a nl pace on a flat grade s sob but does fine slow and flat /  rare saba need  No obvious day to day or daytime variability or assoc excess/ purulent sputum or mucus plugs or hemoptysis or cp or chest tightness, subjective wheeze or overt sinus or hb symptoms. No unusual exp hx or h/o childhood pna/ asthma or knowledge of premature birth.  Sleeping ok without nocturnal  or early am exacerbation  of respiratory  c/o's or need for noct saba. Also denies any obvious fluctuation of symptoms with weather or environmental changes or other aggravating or alleviating factors except as outlined above   Current Medications, Allergies, Complete Past Medical History, Past Surgical History, Family History, and Social History were reviewed in [employer] record.  ROS  The following are not active complaints unless bolded sore throat, dysphagia, dental problems, itching, sneezing,  nasal congestion or excess/ purulent secretions,  ear ache,   fever, chills, sweats, unintended wt loss, classically pleuritic or exertional cp,  orthopnea pnd or leg swelling, presyncope, palpitations, abdominal pain, anorexia, nausea, vomiting, diarrhea  or change in bowel or bladder habits, change in stools or urine, dysuria,hematuria,  rash, arthralgias, visual complaints, headache, numbness, weakness or ataxia or problems with walking or coordination,  change in mood/affect or memory.                 Objective:   Physical Exam   amb wm somber nad   09/22/2016         122  03/24/2016         120  12/04/2015      111 10/23/2015         109  08/24/2015          109  07/13/2015        109   05/02/15 108 lb (48.988 kg)  11/09/07 112 lb 4.8 oz (50.939 kg)  10/12/07 106 lb 14.4 oz (48.49 kg)    Vital signs reviewed -  Note sats 95% on RA on arrival   HEENT: nl dentition, turbinates, and oropharynx. Nl external ear canals without cough reflex   NECK :  without JVD/Nodes/TM/ nl carotid upstrokes bilaterally   LUNGS: no acc muscle use,  Nl contour chest which is clear to A and P bilaterally  With distant bs bilaterally, very distant  late exp rhonchi better with PLM  CV:  RRR  no s3 or murmur or increase in P2, no edema   ABD:  soft and nontender with nl inspiratory excursion in the supine position. No bruits or organomegaly, bowel sounds nl  MS:  Nl gait/ ext warm without deformities, calf tenderness, cyanosis or clubbing No obvious joint restrictions   SKIN: warm and dry without lesions    NEURO:  alert, approp, nl sensorium with  no motor deficits          Assessment & Plan:

## 2016-09-22 NOTE — Patient Instructions (Addendum)
No change in medications  Try to work up to 30 minutes of exercise at least 3 x weekly but pace yourself  Please schedule a follow up visit in 6 months but call sooner if needed

## 2016-09-23 NOTE — Assessment & Plan Note (Signed)
Quit smoking 2015 Spirometry 08/11/13   FEV1  1.67 / FVC 3.65 ratio 46%  05/02/2015  extensive coaching HFA effectiveness =    75% > try symbicort 160 2bid  - alpha one screening 05/02/2015 >  MM, levels ok - PFT's  07/13/2015  FEV1 1.97 (56 % ) ratio 55  p 21 % improvement from saba p symb 160 x2 in am prior to study with DLCO  64/61 % corrects to 72 % for alv volume  - 07/13/2015  add trial of spiriva respimat > no improvement 08/24/2015  After extensive coaching HFA effectiveness =    90% try change to bevespi - 10/23/2015 pt preferred symbicort > bevespi so rec resume symbicort 160 2bid - 12/04/2015 referred to  rehab > 03/24/2016 referred again > declined due to cost   He continues to have dyspnea out of proportion to degree of airflow obst and declines rehab and even paced ex at home and suspect depression /deconditioning playing as big a role as copd here but nothing else to offer for now  I had an extended discussion with the patient reviewing all relevant studies completed to date and  lasting 15 to 20 minutes of a 25 minute visit    Each maintenance medication was reviewed in detail including most importantly the difference between maintenance and prns and under what circumstances the prns are to be triggered using an action plan format that is not reflected in the computer generated alphabetically organized AVS.    Please see AVS for specific instructions unique to this visit that I personally wrote and verbalized to the the pt in detail and then reviewed with pt  by my nurse highlighting any  changes in therapy recommended at today's visit to their plan of care.

## 2017-03-25 ENCOUNTER — Encounter: Payer: Self-pay | Admitting: Internal Medicine

## 2017-03-25 ENCOUNTER — Ambulatory Visit (INDEPENDENT_AMBULATORY_CARE_PROVIDER_SITE_OTHER): Payer: 59 | Admitting: Internal Medicine

## 2017-03-25 VITALS — BP 130/80 | HR 101 | Ht 65.0 in | Wt 113.0 lb

## 2017-03-25 DIAGNOSIS — Z23 Encounter for immunization: Secondary | ICD-10-CM | POA: Diagnosis not present

## 2017-03-25 DIAGNOSIS — J449 Chronic obstructive pulmonary disease, unspecified: Secondary | ICD-10-CM

## 2017-03-25 MED ORDER — ALBUTEROL SULFATE HFA 108 (90 BASE) MCG/ACT IN AERS: 2.0000 | INHALATION_SPRAY | Freq: Four times a day (QID) | RESPIRATORY_TRACT | 3 refills | Status: DC | PRN

## 2017-03-25 NOTE — Progress Notes (Signed)
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 Pulmonary Consult    Self referral for COPD. Pt states he went to [facility] for admission in 2015 and was told he has COPD. Pt c/o DOE, mild cough with little mucus production - clear in color. Pt c/o left sided chest tightness when SOB and occasional feels has sharp pain.   episodic flares x 5 years and need for albuterol varies but has not used in the last week prior to OV  While of advair 250 one bid  First thing in am is tough and more than one flight of steps has to stop at top  Also with his wife up and and down street up to a mile slower than wife's pace rec Plan A = Automatic = Symbicort 160 Take 2 puffs first thing in am and then another 2 puffs about 12 hours later.  Plan B = Backup Only use your albuterol (ventolin)  Please schedule a follow up office visit in 6 weeks, call sooner if needed with pfts ok push back if needed    12/04/2015  f/u ov/[name] re: GOLD II copd/ deconditioning - LUQ quadrant pain improved on rx for IBS  Chief Complaint  Patient presents with  . Follow-up    Breathing is unchanged. His left sid pain has improved on citrucel. He is using ventolin 2-3 x per wk on average.   still doe = MMRC3  rec I would strongly recommend pulmonary rehabilitation > we will set this up> could not afford     03/24/2016  f/u ov/[name] re: GOLD II copd/ symbiocrt 160 2bid and did not think spiriva helped Chief Complaint  Patient presents with  .  Follow-up    Breathing is unchanged. He has some cough with clear sputum.  He uses rescue inhaler 2 x per wk on average.   does stair master 3-5 x per week x 10 min x unmotorized x ? Least resistance   Insurance has changed so looking again at rehab  Some am cough/ congestion but doesn't disturb sleep/ no more cp  rec Check out rehab affordability  To get the most out of exercise, you need to be continuously aware that you are short of breath, but never out of breath, for 30 minutes daily.  Please schedule a follow up visit in  6  months but call sooner if needed      09/22/2016  f/u ov/[name] re:   GOLD II copd/ symb 160 2bid  Preferred over symb/spiriva and bevespi  Chief Complaint  Patient presents with  . Follow-up    Breathing is unchanged. He is using his albuterol inhaler 1-2 x per wk on average.    no noct symptoms/ no am cough Still not exercising as rec "chasing a 69 y old around the house" and declines rehab due to expense Doe = MMRC2 = can't walk a nl pace on a flat grade s sob but does fine slow and flat /  rare saba need rec No change in medications Try to work up to 30 minutes of exercise at least 3 x weekly but pace yourself    03/25/2017  f/u ov/[name] re:  COPD GOLD II/ improved overall  Chief Complaint  Patient presents with  . Follow-up    Increased cough x 10 days with clear to white sputum.  He is using his albuterol inhaler 3-4 x per wk.    Dyspnea:  MMRC2 = can't walk a nl pace on a flat grade s sob but does fine slow and flat eg  but played soccer 03/24/17 wit 43 year old  Cough: worse x 10 d with mucoid sputum/ better with coricidin  Sleep: ok  On 2 pillows  No obvious day to day or daytime variability or assoc excess/ purulent sputum or mucus plugs or hemoptysis or cp or chest tightness, subjective wheeze or overt sinus or hb symptoms. No unusual exposure hx or h/o childhood pna/ asthma or knowledge of premature birth.  Sleeping ok 2 pillows  without nocturnal  or  early am exacerbation  of respiratory  c/o's or need for noct saba. Also denies any obvious fluctuation of symptoms with weather or environmental changes or other aggravating or alleviating factors except as outlined above   Current Allergies, Complete Past Medical History, Past Surgical History, Family History, and Social History were reviewed in [employer] record.  ROS  The following are not active complaints unless bolded Hoarseness, sore throat, dysphagia, dental problems, itching, sneezing,  nasal congestion or discharge of excess mucus or purulent secretions, ear ache,   fever, chills, sweats, unintended wt loss or wt gain, classically pleuritic or exertional cp,  orthopnea pnd or leg swelling, presyncope, palpitations, abdominal pain, anorexia, nausea, vomiting, diarrhea  or change in bowel habits or change in bladder habits, change in stools or change in urine, dysuria, hematuria,  rash, arthralgias, visual complaints, headache, numbness, weakness or ataxia or problems with walking or coordination,  change in mood/affect or memory.        Current Meds  Medication Sig  . albuterol (VENTOLIN HFA) 108 (90 Base) MCG/ACT inhaler Inhale 2 puffs into the lungs every 6 (six) hours as needed for wheezing or shortness of breath. Reported on 07/13/2015  . amLODipine (NORVASC) 5 MG tablet Take 5 mg by mouth every evening.   . budesonide-formoterol (SYMBICORT) 160-4.5 MCG/ACT inhaler Take 2 puffs first thing in am and then another 2 puffs about 12 hours later.  . Chlorphen-Pseudoephed-APAP (CORICIDIN D PO) Take 1 tablet by mouth daily as needed (for cold symptoms).   Marland Kitchen levocetirizine (XYZAL) 5 MG tablet Take 5 mg by mouth daily as needed.   . Multiple Vitamin (MULTIVITAMIN) tablet Take 1 tablet by mouth every evening.   .                    Objective:   Physical Exam   amb wm somewhat brighter affect today    03/25/2017         113  09/22/2016         122  03/24/2016          120  12/04/2015      111 10/23/2015         109  08/24/2015          109  07/13/2015        109   05/02/15 108 lb (48.988 kg)  11/09/07 112 lb 4.8 oz (50.939 kg)  10/12/07 106 lb 14.4 oz (48.49 kg)    Vital signs reviewed - Note on arrival 02 sats  95% on RA    HEENT: nl dentition, turbinates bilaterally, and oropharynx. Nl external ear canals without cough reflex   NECK :  without JVD/Nodes/TM/ nl carotid upstrokes bilaterally   LUNGS: no acc muscle use,  slt barrel contour, distant bs bilaterally  s wheeze    CV:  RRR  no s3 or murmur or increase in P2, and no edema   ABD:  soft and nontender with nl inspiratory excursion in the supine position. No bruits or organomegaly appreciated, bowel sounds nl  MS:  Nl gait/ ext warm without deformities, calf tenderness, cyanosis or clubbing No obvious joint restrictions   SKIN: warm and dry without lesions    NEURO:  alert, approp, nl sensorium with  no motor or cerebellar deficits apparent.                   Assessment & Plan:

## 2017-03-25 NOTE — Patient Instructions (Signed)
No change in medications    Please schedule a follow up visit in 6  months but call sooner if needed  

## 2017-03-26 ENCOUNTER — Encounter: Payer: Self-pay | Admitting: Internal Medicine

## 2017-03-26 NOTE — Assessment & Plan Note (Signed)
Quit smoking 2015 Spirometry 08/11/13   FEV1  1.67 / FVC 3.65 ratio 46%  05/02/2015  extensive coaching HFA effectiveness =    75% > try symbicort 160 2bid  - alpha one screening 05/02/2015 >  MM, levels ok - PFT's  07/13/2015  FEV1 1.97 (56 % ) ratio 55  p 21 % improvement from saba p symb 160 x2 in am prior to study with DLCO  64/61 % corrects to 72 % for alv volume  - 07/13/2015  add trial of spiriva respimat > no improvement 08/24/2015  After extensive coaching HFA effectiveness =    90% try change to bevespi - 10/23/2015 pt preferred symbicort > bevespi so rec resume symbicort 160 2bid - 12/04/2015 referred to  rehab > 03/24/2016 referred again > declined due to cost  Well compensated on present rx > no changes needed > f/u q 6 m  Each maintenance medication was reviewed in detail including most importantly the difference between maintenance and as needed and under what circumstances the prns are to be used.  Please see AVS for specific  Instructions which are unique to this visit and I personally typed out  which were reviewed in detail in writing with the patient and a copy provided.

## 2017-04-01 ENCOUNTER — Other Ambulatory Visit: Payer: Self-pay | Admitting: Internal Medicine
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
## 2017-06-18 ENCOUNTER — Telehealth: Payer: Self-pay | Admitting: Internal Medicine

## 2017-06-18 NOTE — Telephone Encounter (Signed)
Attempted to call patient, no answer, message left to call back.  

## 2017-06-19 NOTE — Telephone Encounter (Signed)
ATC pt, no answer. Left message for pt to call back.  

## 2017-06-22 MED ORDER — MOMETASONE FURO-FORMOTEROL FUM 200-5 MCG/ACT IN AERO: 2.0000 | INHALATION_SPRAY | Freq: Two times a day (BID) | RESPIRATORY_TRACT | 4 refills | Status: DC

## 2017-06-22 NOTE — Telephone Encounter (Signed)
Try  dulera 200 2bid  Instead

## 2017-06-22 NOTE — Telephone Encounter (Signed)
Called and spoke to patient. Advised him of MW's recommendations. Patient stated he wants Rx sent to [facility] on [address]. Rx sent to preferred pharmacy. Nothing further needed at this time.

## 2017-06-22 NOTE — Telephone Encounter (Signed)
Called and spoke with patient, he states that his insurance is not covering the Symbicort anymore. MW gave patient a card for free symbicort for 12 months. He states that the card was not activated so they used his FSA and he now has none.   MW please advise would you like for [place] to do a PA or prescribed another medication. Thanks.

## 2017-06-23 ENCOUNTER — Telehealth: Payer: Self-pay | Admitting: Internal Medicine

## 2017-06-23 NOTE — Telephone Encounter (Signed)
PA request received from [facility] CMM Key: [id number] PA request has been sent to plan, and a determination is expected within 72 hours.   Routing to LMR for follow-up.

## 2017-06-25 NOTE — Telephone Encounter (Signed)
[facility] PA Denied on May 7   Request Reference Number: [id number]. DULERA AER [id number] is denied for not meeting the prior authorization requirement(s). For further questions, call [phone]. Appeals are not supported through [place]. Please refer to the fax case notice for appeals information and instructions.  DrugDulera [id number]/ACT IN [employer] Electronic Prior Authorization Form  Original Claim Info70  Denied.  Will route to [place]

## 2017-09-22 ENCOUNTER — Ambulatory Visit (INDEPENDENT_AMBULATORY_CARE_PROVIDER_SITE_OTHER): Payer: 59 | Admitting: Internal Medicine

## 2017-09-22 ENCOUNTER — Encounter: Payer: Self-pay | Admitting: Internal Medicine

## 2017-09-22 VITALS — BP 124/86 | HR 116 | Ht 65.0 in | Wt 113.0 lb

## 2017-09-22 DIAGNOSIS — J441 Chronic obstructive pulmonary disease with (acute) exacerbation: Secondary | ICD-10-CM | POA: Diagnosis not present

## 2017-09-22 MED ORDER — PREDNISONE 10 MG PO TABS: ORAL_TABLET | ORAL | 0 refills | Status: DC

## 2017-09-22 NOTE — Assessment & Plan Note (Signed)
Quit smoking 2015 Spirometry 08/11/13   FEV1  1.67 / FVC 3.65 ratio 46%  05/02/2015  extensive coaching HFA effectiveness =    75% > try symbicort 160 2bid  - alpha one screening 05/02/2015 >  MM, levels ok - PFT's  07/13/2015  FEV1 1.97 (56 % ) ratio 55  p 21 % improvement from saba p symb 160 x2 in am prior to study with DLCO  64/61 % corrects to 72 % for alv volume  - 07/13/2015  add trial of spiriva respimat > no improvement 08/24/2015  After extensive coaching HFA effectiveness =    90% try change to bevespi - 10/23/2015 pt preferred symbicort > bevespi so rec resume symbicort 160 2bid - 12/04/2015 referred to  rehab > 03/24/2016 referred again > declined due to cost   Mild flare of cough/ wheeze since resumed smoking  rec  No change maint rx  Work hard at d/c all cigs Prednisone 10 mg take  4 each am x 2 days,   2 each am x 2 days,  1 each am x 2 days and stop  F/u at 6 months unless not back to baseline   I had an extended discussion with the patient reviewing all relevant studies completed to date and  lasting 15 to 20 minutes of a 25 minute visit    Each maintenance medication was reviewed in detail including most importantly the difference between maintenance and prns and under what circumstances the prns are to be triggered using an action plan format that is not reflected in the computer generated alphabetically organized AVS.    Please see AVS for specific instructions unique to this visit that I personally wrote and verbalized to the the pt in detail and then reviewed with pt  by my nurse highlighting any  changes in therapy recommended at today's visit to their plan of care.    Extra time spent on smoking  Issues to help him understand even passive exp and "bumming a cigarette" are to be avoided completely to prevent further lung injury

## 2017-09-22 NOTE — Patient Instructions (Signed)
Prednisone 10 mg take  4 each am x 2 days,   2 each am x 2 days,  1 each am x 2 days and stop   The key is to stop smoking completely before smoking completely stops you!    Please schedule a follow up visit in 6  months but call sooner if needed

## 2017-09-22 NOTE — Progress Notes (Addendum)
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 Pulmonary Consult    Self referral for COPD. Pt states he went to [facility] for admission in 2015 and was told he has COPD. Pt c/o DOE, mild cough with little mucus production - clear in color. Pt c/o left sided chest tightness when SOB and occasional feels has sharp pain.   episodic flares x 5 years and need for albuterol varies but has not used in the last week prior to OV  While of advair 250 one bid  First thing in am is tough and more than one flight of steps has to stop at top  Also with his wife up and and down street up to a mile slower than wife's pace rec Plan A = Automatic = Symbicort 160 Take 2 puffs first thing in am and then another 2 puffs about 12 hours later.  Plan B = Backup Only use your albuterol (ventolin)  Please schedule a follow up office visit in 6 weeks, call sooner if needed with pfts ok push back if needed    12/04/2015  f/u ov/Marlei Glomski re: GOLD II copd/ deconditioning - LUQ quadrant pain improved on rx for IBS  Chief Complaint  Patient presents with  . Follow-up    Breathing is unchanged. His left sid pain has improved on citrucel. He is using ventolin 2-3 x per wk on average.   still doe = MMRC3  rec I would strongly recommend pulmonary rehabilitation > we will set this up> could not afford     03/24/2016  f/u ov/Wayland Baik re: GOLD II copd/ symbiocrt 160 2bid and did not think spiriva helped Chief Complaint  Patient presents with  .  Follow-up    Breathing is unchanged. He has some cough with clear sputum.  He uses rescue inhaler 2 x per wk on average.   does stair master 3-5 x per week x 10 min x unmotorized x ? Least resistance   Insurance has changed so looking again at rehab  Some am cough/ congestion but doesn't disturb sleep/ no more cp  rec Check out rehab affordability  To get the most out of exercise, you need to be continuously aware that you are short of breath, but never out of breath, for 30 minutes daily.  Please schedule a follow up visit in  6  months but call sooner if needed      09/22/2016  f/u ov/Viral Schramm re:   GOLD II copd/ symb 160 2bid  Preferred over symb/spiriva and bevespi  Chief Complaint  Patient presents with  . Follow-up    Breathing is unchanged. He is using his albuterol inhaler 1-2 x per wk on average.    no noct symptoms/ no am cough Still not exercising as rec "chasing a 43 y old around the house" and declines rehab due to expense Doe = MMRC2 = can't walk a nl pace on a flat grade s sob but does fine slow and flat /  rare saba need rec No change in medications Try to work up to 30 minutes of exercise at least 3 x weekly but pace yourself    03/25/2017  f/u ov/Raylin Diguglielmo re:  COPD GOLD II/ improved overall  Chief Complaint  Patient presents with  . Follow-up    Increased cough x 10 days with clear to white sputum.  He is using his albuterol inhaler 3-4 x per wk.    Dyspnea:  MMRC2 = can't walk a nl pace on a flat grade s sob but does fine slow and flat    but played soccer 03/24/17 wit 43 year old  Cough: worse x 10 d with mucoid sputum/ better with coricidin  Sleep: ok  On 2 pillows rec No change    09/22/2017  f/u ov/Eleftheria Taborn re:  COPD  GOLD II  Chief Complaint  Patient presents with  . Follow-up    Breathing is "not too bad" No new co's.  He is using his albuterol inhaler 2-3 x per wk on average.    Dyspnea:  MMRC2 = can't walk a nl pace on a flat grade s sob but does fine slow and flat  walks with wife 30 min 3 x weekly Cough: flared up x one week, just white mucus, better with mucinex   SABA use: as above    No obvious day to day or daytime variability or assoc  purulent sputum or mucus plugs or hemoptysis or cp or chest tightness, subjective wheeze or overt sinus or hb symptoms.   Sleeping: on flat bed and 2 piilows  without nocturnal  or early am exacerbation  of respiratory  c/o's or need for noct saba. Also denies any obvious fluctuation of symptoms with weather or environmental changes or other aggravating or alleviating factors except as outlined above   No unusual exposure hx or h/o childhood pna/ asthma or knowledge of premature birth.  Current Allergies, Complete Past Medical History, Past Surgical History, Family History, and Social History were reviewed in [employer] record.  ROS  The following are not active complaints unless bolded Hoarseness, sore throat, dysphagia, dental problems, itching, sneezing,  nasal congestion or discharge of excess mucus or purulent secretions, ear ache,   fever, chills, sweats, unintended wt loss or wt gain, classically pleuritic or exertional cp,  orthopnea pnd or arm/hand swelling  or leg swelling, presyncope, palpitations, abdominal pain, anorexia, nausea, vomiting, diarrhea  or change in bowel habits or change in bladder habits, change in stools or change in urine, dysuria, hematuria,  rash, arthralgias, visual complaints, headache, numbness, weakness or ataxia or problems with walking or coordination,  change in mood or  memory.        Current Meds  Medication Sig  . albuterol (VENTOLIN HFA) 108 (90 Base) MCG/ACT inhaler Inhale 2 puffs into the lungs every 6 (six) hours as needed for wheezing or shortness of breath. Reported on 07/13/2015  . amLODipine (NORVASC) 5 MG tablet Take 5 mg by mouth every evening.   . budesonide-formoterol (SYMBICORT) 160-4.5 MCG/ACT inhaler TAKE 2 PUFFS BY MOUTH FIRST THING IN  MORNING AND THEN 2 PUFFS ABOUT 12 HOURS LATER  . Chlorphen-Pseudoephed-APAP (CORICIDIN D PO) Take 1 tablet by mouth daily as needed (for cold symptoms).   Marland Kitchen levocetirizine (XYZAL) 5 MG tablet Take 5 mg by mouth daily as needed.   . Multiple Vitamin (MULTIVITAMIN) tablet Take 1 tablet by mouth every evening.  Objective:   Physical Exam   amb wm nad   09/22/2017         113 03/25/2017         113  09/22/2016         122  03/24/2016         120  12/04/2015      111 10/23/2015         109  08/24/2015          109  07/13/2015        109   05/02/15 108 lb (48.988 kg)  11/09/07 112 lb 4.8 oz (50.939 kg)  10/12/07 106 lb 14.4 oz (48.49 kg)    Vital signs reviewed - Note on arrival 02 sats  95% on RA   HEENT: nl dentition, turbinates bilaterally, and oropharynx. Nl external ear canals without cough reflex   NECK :  without JVD/Nodes/TM/ nl carotid upstrokes bilaterally   LUNGS: no acc muscle use,  Nl contour chest  With mid exp wheeze bilaterally  without cough on insp or exp maneuvers   CV:  RRR  no s3 or murmur or increase in P2, and no edema   ABD:  soft and nontender with nl inspiratory excursion in the supine position. No bruits or organomegaly appreciated, bowel sounds nl  MS:  Nl gait/ ext warm without deformities, calf tenderness, cyanosis or clubbing No obvious joint restrictions   SKIN: warm and dry without lesions    NEURO:  alert, approp, nl sensorium with  no motor or cerebellar deficits apparent.          Assessment & Plan:

## 2017-11-25 ENCOUNTER — Other Ambulatory Visit: Payer: Self-pay | Admitting: Internal Medicine

## 2018-03-25 ENCOUNTER — Encounter: Payer: Self-pay | Admitting: Internal Medicine

## 2018-03-25 ENCOUNTER — Ambulatory Visit (INDEPENDENT_AMBULATORY_CARE_PROVIDER_SITE_OTHER)
Admission: RE | Admit: 2018-03-25 | Discharge: 2018-03-25 | Disposition: A | Discharge status: Home / Self Care | Payer: Federal, State, Local not specified - PPO | Source: Ambulatory Visit | Attending: Internal Medicine | Admitting: Internal Medicine

## 2018-03-25 ENCOUNTER — Ambulatory Visit (INDEPENDENT_AMBULATORY_CARE_PROVIDER_SITE_OTHER): Payer: Federal, State, Local not specified - PPO | Admitting: Internal Medicine

## 2018-03-25 VITALS — BP 98/60 | HR 60 | Ht 65.0 in | Wt 113.4 lb

## 2018-03-25 DIAGNOSIS — J449 Chronic obstructive pulmonary disease, unspecified: Secondary | ICD-10-CM

## 2018-03-25 MED ORDER — ALBUTEROL SULFATE (2.5 MG/3ML) 0.083% IN NEBU: 2.5000 mg | INHALATION_SOLUTION | RESPIRATORY_TRACT | Status: AC | PRN

## 2018-03-25 NOTE — Assessment & Plan Note (Addendum)
Quit smoking 2015 - though still smoking intermittently  Spirometry 08/11/13   FEV1  1.67 / FVC 3.65 ratio 46%  05/02/2015  extensive coaching HFA effectiveness =    75% > try symbicort 160 2bid  - alpha one screening 05/02/2015 >  MM, levels ok - PFT's  07/13/2015  FEV1 1.97 (56 % ) ratio 55  p 21 % improvement from saba p symb 160 x2 in am prior to study with DLCO  64/61 % corrects to 72 % for alv volume  - 07/13/2015  add trial of spiriva respimat > no improvement - 10/23/2015 pt preferred symbicort > bevespi so rec resume symbicort 160 2bid - 12/04/2015 referred to  rehab > 03/24/2016 referred again > declined due to cost  - 03/25/2018  After extensive coaching inhaler device,  effectiveness =    90% ventolin   Acute flare in setting of likely viral uri/ bronchopna > rx saba prn / no further abx/ add pred x 6 days if not improving back to baseline   I had an extended discussion with the patient reviewing all relevant studies completed to date and  lasting 15 to 20 minutes of a 25 minute acute office  visit    See device teaching which extended face to face time for this visit.  Each maintenance medication was reviewed in detail including emphasizing most importantly the difference between maintenance and prns and under what circumstances the prns are to be triggered using an action plan format that is not reflected in the computer generated alphabetically organized AVS which I have not found useful in most complex patients, especially with respiratory illnesses  Please see AVS for specific instructions unique to this visit that I personally wrote and verbalized to the the pt in detail and then reviewed with pt  by my nurse highlighting any  changes in therapy recommended at today's visit to their plan of care.

## 2018-03-25 NOTE — Patient Instructions (Addendum)
For cough > mucinex dm 1200 mg every 12 hours as needed  Try prilosec otc 4m  Take 30-60 min before first meal of the day and Pepcid ac (famotidine) 20 mg one @  bedtime until cough is completely gone for at least a week without the need for cough suppression  For stuffy head/ ears  > advil cold and sinus    Please remember to go to the  x-ray department  for your tests - we will call you with the results when they are available     Plan A = Automatic = symbicort 160 Take 2 puffs first thing in am and then another 2 puffs about 12 hours later.      Plan B = Backup Only use your albuterol inhaler as a rescue medication to be used if you can't catch your breath by resting or doing a relaxed purse lip breathing pattern.  - The less you use it, the better it will work when you need it. - Ok to use the inhaler up to 2 puffs  every 4 hours if you must but call for appointment if use goes up over your usual need - Don't leave home without it !!  (think of it like the spare tire for your car)   Plan C = Crisis - only use your albuterol nebulizer if you first try Plan B and it fails to help > ok to use the nebulizer up to every 4 hours but if start needing it regularly call for immediate appointment     Please schedule a follow up visit in 6 months but call sooner if needed

## 2018-03-25 NOTE — Progress Notes (Signed)
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 Pulmonary Consult    Self referral for COPD. Pt states he went to [facility] for admission in 2015 and was told he has COPD. Pt c/o DOE, mild cough with little mucus production - clear in color. Pt c/o left sided chest tightness when SOB and occasional feels has sharp pain.   episodic flares x 5 years and need for albuterol varies but has not used in the last week prior to OV  While of advair 250 one bid  First thing in am is tough and more than one flight of steps has to stop at top  Also with his wife up and and down street up to a mile slower than wife's pace rec Plan A = Automatic = Symbicort 160 Take 2 puffs first thing in am and then another 2 puffs about 12 hours later.  Plan B = Backup Only use your albuterol (ventolin)  Please schedule a follow up office visit in 6 weeks, call sooner if needed with pfts ok push back if needed       09/22/2017  f/u ov/Lecil Tapp re:  COPD  GOLD II  Chief Complaint  Patient presents with  . Follow-up    Breathing is "not too bad" No new co's.  He is using his albuterol inhaler 2-3 x per wk on average.    Dyspnea:  MMRC2 = can't walk a nl pace on a flat grade s sob but does fine slow and flat walks with wife 30 min 3 x weekly Cough: flared up x one week, just white mucus, better with mucinex  SABA use: as above  rec Prednisone 10 mg take  4 each am x 2 days,   2 each am x 2 days,  1 each am x 2 days and stop  The key is to stop  smoking completely before smoking completely stops you!    03/25/2018  f/u ov/Brynnan Rodenbaugh re:  Chief Complaint  Patient presents with  . Bacterial infection for last 2 weeks    Was given medication for treatment, but not progessing as fast as expected.  acute onset Jan 20th 2020 woke up with runny eyes/nose/ aching all over/ hacking cough > white mucus and more sob then having to double use albuterol helped so 3-4 days later PCP in [place] pred rx and nebs and levaquin > 60% improved with sense of chest congestion/rattling esp in am and hs  gen chest discomfort anteriorly with coughing fits, nothing localized    No obvious day to day or daytime variability or assoc excess/ purulent sputum or mucus plugs or hemoptysis or   chest tightness, subjective wheeze or overt sinus or hb symptoms.     Also denies any obvious fluctuation of symptoms with weather or environmental changes or other aggravating or alleviating factors except as outlined above   No unusual exposure hx or h/o childhood pna/ asthma or knowledge of premature birth.  Current Allergies, Complete Past  Medical History, Past Surgical History, Family History, and Social History were reviewed in [employer] record.  ROS  The following are not active complaints unless bolded Hoarseness, sore throat, dysphagia, dental problems, itching, sneezing,  nasal congestion or discharge of excess mucus or purulent secretions, ear ache,   fever, chills, sweats, unintended wt loss or wt gain, classically pleuritic or exertional cp,  orthopnea pnd or arm/hand swelling  or leg swelling, presyncope, palpitations, abdominal pain, anorexia, nausea, vomiting, diarrhea  or change in bowel habits or change in bladder habits, change in stools or change in urine, dysuria, hematuria,  rash, arthralgias, visual complaints, headache, numbness, weakness or ataxia or problems with walking or coordination,  change in mood or  memory.        Current  Meds  Medication Sig  . albuterol (VENTOLIN HFA) 108 (90 Base) MCG/ACT inhaler Inhale 2 puffs into the lungs every 6 (six) hours as needed for wheezing or shortness of breath. Reported on 07/13/2015  . amLODipine (NORVASC) 5 MG tablet TAKE 1 TABLET BY MOUTH EVERY DAY  . budesonide-formoterol (SYMBICORT) 160-4.5 MCG/ACT inhaler TAKE 2 PUFFS BY MOUTH FIRST THING IN MORNING AND THEN 2 PUFFS ABOUT 12 HOURS LATER  . Chlorphen-Pseudoephed-APAP (CORICIDIN D PO) Take 1 tablet by mouth daily as needed (for cold symptoms).   Marland Kitchen levocetirizine (XYZAL) 5 MG tablet Take 5 mg by mouth daily as needed.   . Multiple Vitamin (MULTIVITAMIN) tablet Take 1 tablet by mouth every evening.   . predniSONE (DELTASONE) 10 MG tablet Take as directed           Objective:   Physical Exam   Thin amb wm nad at all   03/25/2018         113 09/22/2017         113 03/25/2017         113  09/22/2016         122  03/24/2016         120  12/04/2015      111 10/23/2015         109  08/24/2015          109  07/13/2015        109   05/02/15 108 lb (48.988 kg)  11/09/07 112 lb 4.8 oz (50.939 kg)  10/12/07 106 lb 14.4 oz (48.49 kg)    Vital signs reviewed - Note on arrival 02 sats  98% on RA       HEENT: nl dentition / oropharynx. Nl external ear canals without cough reflex -  Mild bilateral non-specific turbinate edema     NECK :  without JVD/Nodes/TM/ nl carotid upstrokes bilaterally   LUNGS: no acc muscle use,  Mild barrel  contour chest wall with bilateral distant insp/exp rhonchi   without cough on insp or exp maneuver and mild  Hyperresonant  to  percussion bilaterally     CV:  RRR  no s3 or murmur or increase in P2, and no edema   ABD:  soft and nontender with pos mid  insp Hoover's  in the supine position. No bruits or organomegaly appreciated, bowel sounds nl  MS:   Nl gait/  ext warm without deformities, calf tenderness, cyanosis or clubbing No obvious joint restrictions   SKIN: warm and dry without lesions     NEURO:  alert, approp, nl sensorium with  no motor or cerebellar deficits apparent.      CXR PA and Lateral:  03/25/2018 :    I personally reviewed images and agree with radiology impression as follows:    There are extensive bilateral irregular ground-glass pulmonary opacities, generally consistent with multifocal atypical or viral Infection. My review:  Changes are minimal and non-specific and no indication per se for additional abx          Assessment & Plan:

## 2018-03-26 ENCOUNTER — Encounter: Payer: Self-pay | Admitting: Internal Medicine

## 2018-03-26 NOTE — Progress Notes (Signed)
LMTCB

## 2018-03-29 NOTE — Progress Notes (Signed)
LMTCB

## 2018-03-30 ENCOUNTER — Other Ambulatory Visit: Payer: Self-pay | Admitting: Internal Medicine

## 2018-03-30 MED ORDER — PREDNISONE 10 MG PO TABS: ORAL_TABLET | ORAL | 0 refills | Status: DC

## 2018-03-30 NOTE — Progress Notes (Signed)
Spoke with pt and notified of results per Dr. Melvyn Novas. Pt verbalized understanding and denied any questions. Rx sent and 2 wk ov scheduled

## 2018-04-09 ENCOUNTER — Other Ambulatory Visit: Payer: Self-pay | Admitting: Internal Medicine

## 2018-04-09 DIAGNOSIS — J449 Chronic obstructive pulmonary disease, unspecified: Secondary | ICD-10-CM

## 2018-04-12 ENCOUNTER — Ambulatory Visit: Payer: Federal, State, Local not specified - PPO | Admitting: Internal Medicine

## 2018-04-12 ENCOUNTER — Ambulatory Visit (INDEPENDENT_AMBULATORY_CARE_PROVIDER_SITE_OTHER)
Admission: RE | Admit: 2018-04-12 | Discharge: 2018-04-12 | Disposition: A | Discharge status: Home / Self Care | Payer: Federal, State, Local not specified - PPO | Source: Ambulatory Visit | Attending: Internal Medicine | Admitting: Internal Medicine

## 2018-04-12 ENCOUNTER — Encounter: Payer: Self-pay | Admitting: Internal Medicine

## 2018-04-12 VITALS — BP 150/80 | HR 87 | Ht 65.0 in | Wt 111.6 lb

## 2018-04-12 DIAGNOSIS — J449 Chronic obstructive pulmonary disease, unspecified: Secondary | ICD-10-CM

## 2018-04-12 DIAGNOSIS — F17210 Nicotine dependence, cigarettes, uncomplicated: Secondary | ICD-10-CM

## 2018-04-12 MED ORDER — PREDNISONE 10 MG PO TABS: ORAL_TABLET | ORAL | 0 refills | Status: DC

## 2018-04-12 MED ORDER — ACETAMINOPHEN-CODEINE #3 300-30 MG PO TABS: 1.0000 | ORAL_TABLET | ORAL | 0 refills | Status: AC | PRN

## 2018-04-12 MED ORDER — BUDESONIDE-FORMOTEROL FUMARATE 160-4.5 MCG/ACT IN AERO: INHALATION_SPRAY | RESPIRATORY_TRACT | 11 refills | Status: DC

## 2018-04-12 MED ORDER — BUDESONIDE-FORMOTEROL FUMARATE 160-4.5 MCG/ACT IN AERO: INHALATION_SPRAY | RESPIRATORY_TRACT | 0 refills | Status: DC

## 2018-04-12 NOTE — Patient Instructions (Addendum)
Only use your albuterol as a rescue medication to be used if you can't catch your breath by resting or doing a relaxed purse lip breathing pattern.  - The less you use it, the better it will work when you need it. - Ok to use up to 2 puffs  every 4 hours if you must but call for immediate appointment if use goes up over your usual need - Don't leave home without it !!  (think of it like the spare tire for your car)    Prednisone 10 mg take  4 each am x 2 days,   2 each am x 2 days,  1 each am x 2 days and stop    For cough  > mucinex dm 1200 mg every 12 hours with tylenol # 3 up to every 4hours as needed    Please schedule a follow up office visit in 4 weeks, sooner if needed  with all medications /inhalers/ solutions in hand so we can verify exactly what you are taking. This includes all medications from all doctors and over the counters - add: consider spiriva smi next ov

## 2018-04-12 NOTE — Progress Notes (Signed)
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 Pulmonary Consult    Self referral for COPD. Pt states he went to [facility] admission in 2015 and was told he has COPD. Pt c/o DOE, mild cough with little mucus production - clear in color. Pt c/o left sided chest tightness when SOB and occasional feels has sharp pain.   episodic flares x 5 years and need for albuterol varies but has not used in the last week prior to OV  While of advair 250 one bid  First thing in am is tough and more than one flight of steps has to stop at top  Also with his wife up and and down street up to a mile slower than wife's pace rec Plan A = Automatic = Symbicort 160 Take 2 puffs first thing in am and then another 2 puffs about 12 hours later.  Plan B = Backup Only use your albuterol (ventolin)  Please schedule a follow up office visit in 6 weeks, call sooner if needed with pfts ok push back if needed       09/22/2017  f/u ov/[name] re:  COPD  GOLD II  Chief Complaint  Patient presents with  . Follow-up    Breathing is "not too bad" No new co's.  He is using his albuterol inhaler 2-3 x per wk on average.    Dyspnea:  MMRC2 = can't walk a nl pace on a flat grade s sob but does fine slow and flat walks with wife 30 min 3 x weekly Cough: flared up x one week, just white mucus, better with mucinex  SABA use: as above  rec Prednisone 10 mg take  4 each am x 2 days,   2 each am x 2 days,  1 each am x 2 days and stop  The key is to stop  smoking completely before smoking completely stops you!    03/25/2018  f/u ov/[name] re: acute cough  Chief Complaint  Patient presents with  . Bacterial infection for last 2 weeks    Was given medication for treatment, but not progessing as fast as expected.  acute onset Jan 20th 2020 woke up with runny eyes/nose/ aching all over/ hacking cough > white mucus and more sob then having to double use albuterol helped so 3-4 days later PCP in [place] pred rx and nebs and levaquin > 60% improved with sense of chest congestion/rattling esp in am and hs  gen chest discomfort anteriorly with coughing fits, nothing localized  rec For cough > mucinex dm 1200 mg every 12 hours as needed Try prilosec otc 64m  Take 30-60 min before first meal of the day and Pepcid ac (famotidine) 20 mg one @  bedtime until cough is completely gone for at least a week without the need for cough suppression For stuffy head/ ears  > advil cold and sinus  Please remember to go to the  x-ray department  for your tests -  we will call you with the results when they are available   Plan A = Automatic = symbicort 160 Take 2 puffs first thing in am and then another 2 puffs about 12 hours later.  Plan B = Backup Only use your albuterol inhaler as a rescue medication  Plan C = Crisis - only use your albuterol nebulizer if you first try Plan B and it fails to help > ok to use the nebulizer up to every 4 hours but if start needing it regularly call for immediate appointment    04/12/2018  f/u ov/[name] re:  Copd ae/  Says last smoked before xmas  Chief Complaint  Patient presents with  . Follow-up    CXR repeated. Pt states still having wheezing, coughing and chest tightness. He is using his ventolin inhaler 8 x per day and neb at least 2 x per day  Dyspnea:  Any store shopping x 30 min gives out due to sob  Cough:  Worse when lie down and sporadically during the day, worse in am again x 1-2 tbsp mucoid  Sleeping: bed flat, 2  pillows/ rarely needs saba noct  SABA use: way over use as above  02: none   No obvious day to day or daytime variability or assoc excess/ purulent sputum or mucus plugs or hemoptysis  Or  overt sinus or hb symptoms.    Also denies any obvious fluctuation of symptoms with weather or environmental changes or other aggravating or alleviating factors except as outlined above   No unusual exposure hx or h/o childhood pna/ asthma or knowledge of premature birth.  Current Allergies, Complete Past Medical History, Past Surgical History, Family History, and Social History were reviewed in [employer] record.  ROS  The following are not active complaints unless bolded Hoarseness, sore throat, dysphagia, dental problems, itching, sneezing,  nasal congestion or discharge of excess mucus or purulent secretions, ear ache,   fever, chills, sweats, unintended wt loss or wt gain, classically pleuritic or exertional cp,  orthopnea pnd or arm/hand swelling  or leg swelling, presyncope, palpitations, abdominal pain, anorexia, nausea, vomiting, diarrhea  or change in bowel habits or change in bladder habits, change in stools or change in urine, dysuria, hematuria,  rash, arthralgias, visual complaints, headache, numbness, weakness or ataxia or problems with walking or coordination,  change in mood or  memory.        Current Meds  Medication Sig  . albuterol (PROVENTIL) (2.5 MG/3ML) 0.083% nebulizer solution Take 3 mLs (2.5 mg total) by nebulization every 4 (four) hours as needed for wheezing or shortness of breath.  Marland Kitchen albuterol (VENTOLIN HFA) 108 (90 Base) MCG/ACT inhaler Inhale 2 puffs into the lungs every 6 (six) hours as needed for wheezing or shortness of breath. Reported on 07/13/2015  . amLODipine (NORVASC) 5 MG tablet TAKE 1 TABLET BY MOUTH EVERY DAY  . budesonide-formoterol (SYMBICORT) 160-4.5 MCG/ACT inhaler TAKE 2 PUFFS BY MOUTH FIRST THING IN MORNING AND THEN 2 PUFFS ABOUT 12 HOURS  LATER  . levocetirizine (XYZAL) 5 MG tablet Take 5 mg by mouth daily as needed.   . Multiple Vitamin (MULTIVITAMIN) tablet Take 1 tablet by mouth every evening.             Objective:   Physical Exam   Thin somber amb wm nad p saba  one hour prior to OV     04/12/2018       111 03/25/2018  113 09/22/2017         113 03/25/2017         113  09/22/2016         122  03/24/2016         120  12/04/2015      111 10/23/2015         109  08/24/2015          109  07/13/2015        109   05/02/15 108 lb (48.988 kg)  11/09/07 112 lb 4.8 oz (50.939 kg)  10/12/07 106 lb 14.4 oz (48.49 kg)      Vital signs reviewed - Note on arrival 02 sats  95% on RA       HEENT: nl dentition / oropharynx. Nl external ear canals without cough reflex -  Mild bilateral non-specific turbinate edema     NECK :  without JVD/Nodes/TM/ nl carotid upstrokes bilaterally   LUNGS: no acc muscle use,  Mild barrel  contour chest wall with bilateral  Distant bs s audible wheeze and  without cough on insp or exp maneuver and mild  Hyperresonant  to  percussion bilaterally     CV:  RRR  no s3 or murmur or increase in P2, and no edema   ABD:  soft and nontender with pos mid/late insp Hoover's  in the supine position. No bruits or organomegaly appreciated, bowel sounds nl  MS:   Nl gait/  ext warm without deformities, calf tenderness, cyanosis or clubbing No obvious joint restrictions   SKIN: warm and dry without lesions    NEURO:  alert, approp, nl sensorium with  no motor or cerebellar deficits apparent.        CXR PA and Lateral:   04/12/2018 :    I personally reviewed images and agree with radiology impression as follows:    Less interstitial thickening in the mid lung regions. Suspect resolving viral type pneumonitis. No edema or consolidation. No new opacity. No adenopathy.          Assessment & Plan:

## 2018-04-13 NOTE — Progress Notes (Signed)
LMTCB

## 2018-04-15 NOTE — Progress Notes (Signed)
Spoke with pt and notified of results per Dr. Wert. Pt verbalized understanding and denied any questions. 

## 2018-04-18 ENCOUNTER — Encounter: Payer: Self-pay | Admitting: Internal Medicine

## 2018-04-18 DIAGNOSIS — F17210 Nicotine dependence, cigarettes, uncomplicated: Secondary | ICD-10-CM | POA: Insufficient documentation

## 2018-04-18 NOTE — Assessment & Plan Note (Addendum)
Quit smoking regularly 2015   smoking intermittently since then Spirometry 08/11/13   FEV1  1.67 / FVC 3.65 ratio 0.46  05/02/2015  extensive coaching HFA effectiveness =    75% > try symbicort 160 2bid  - alpha one screening 05/02/2015 >  MM, levels ok - PFT's  07/13/2015  FEV1 1.97 (56 % ) ratio 55  p 21 % improvement from saba p symb 160 x2 in am prior to study with DLCO  64/61 % corrects to 72 % for alv volume  - 07/13/2015  add trial of spiriva respimat > no improvement - 10/23/2015 pt preferred symbicort > bevespi so rec resume symbicort 160 2bid - 12/04/2015 referred to  rehab > 03/24/2016 referred again > declined due to cost - 03/25/2018  After extensive coaching inhaler device,  effectiveness =    90% saba   Improved after recent flare and rx for pna which has resolved radiographically but still way over using saba  Records show he's been on lama/laba but never lama/laba /ics so rec pred x 6 days and try hard to stop all smoking and if not better, no less saba need the next step is to add on spiriva smi

## 2018-04-18 NOTE — Assessment & Plan Note (Addendum)
Counseled re importance of smoking cessation but did not meet time criteria for separate billing     I had an extended discussion with the patient reviewing all relevant studies completed to date and  lasting 15 to 20 minutes of a 25 minute visit    See device teaching which extended face to face time for this visit.  Each maintenance medication was reviewed in detail including emphasizing most importantly the difference between maintenance and prns and under what circumstances the prns are to be triggered using an action plan format that is not reflected in the computer generated alphabetically organized AVS which I have not found useful in most complex patients, especially with respiratory illnesses  Please see AVS for specific instructions unique to this visit that I personally wrote and verbalized to the the pt in detail and then reviewed with pt  by my nurse highlighting any  changes in therapy recommended at today's visit to their plan of care.

## 2018-05-11 ENCOUNTER — Ambulatory Visit: Payer: Self-pay | Admitting: Internal Medicine

## 2018-09-20 ENCOUNTER — Ambulatory Visit: Payer: Federal, State, Local not specified - PPO | Admitting: Internal Medicine

## 2018-09-20 ENCOUNTER — Encounter: Payer: Self-pay | Admitting: Internal Medicine

## 2018-09-20 ENCOUNTER — Other Ambulatory Visit: Payer: Self-pay

## 2018-09-20 DIAGNOSIS — J449 Chronic obstructive pulmonary disease, unspecified: Secondary | ICD-10-CM

## 2018-09-20 MED ORDER — BUDESONIDE-FORMOTEROL FUMARATE 160-4.5 MCG/ACT IN AERO: INHALATION_SPRAY | RESPIRATORY_TRACT | 11 refills | Status: DC

## 2018-09-20 NOTE — Patient Instructions (Addendum)
Try taking prilosec 20 mg x 30 min before supper to see if nightime cough improves over about a month.  GERD (REFLUX)  is an extremely common cause of respiratory symptoms just like yours , many times with no obvious heartburn at all.    It can be treated with medication, but also with lifestyle changes including elevation of the head of your bed (ideally with 6 -8inch blocks under the headboard of your bed),  Smoking cessation, avoidance of late meals, excessive alcohol, and avoid fatty foods, chocolate, peppermint, colas, red wine, and acidic juices such as orange juice.  NO MINT OR MENTHOL PRODUCTS SO NO COUGH DROPS  USE SUGARLESS CANDY INSTEAD (Jolley ranchers or Stover's or Life Savers) or even ice chips will also do - the key is to swallow to prevent all throat clearing. NO OIL BASED VITAMINS - use powdered substitutes.  Avoid fish oil when coughing.     Please schedule a follow up visit in 6  months but call sooner if needed

## 2018-09-20 NOTE — Progress Notes (Signed)
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 Pulmonary Consult    Self referral for COPD. Pt states he went to [facility] for admission in 2015 and was told he has COPD. Pt c/o DOE, mild cough with little mucus production - clear in color. Pt c/o left sided chest tightness when SOB and occasional feels has sharp pain.   episodic flares x 5 years and need for albuterol varies but has not used in the last week prior to OV  While of advair 250 one bid  First thing in am is tough and more than one flight of steps has to stop at top  Also with his wife up and and down street up to a mile slower than wife's pace rec Plan A = Automatic = Symbicort 160 Take 2 puffs first thing in am and then another 2 puffs about 12 hours later.  Plan B = Backup Only use your albuterol (ventolin)  Please schedule a follow up office visit in 6 weeks, call sooner if needed with pfts ok push back if needed       09/22/2017  f/u ov/Ethan Norton re:  COPD  GOLD II  Chief Complaint  Patient presents with  . Follow-up    Breathing is "not too bad" No new co's.  He is using his albuterol inhaler 2-3 x per wk on average.    Dyspnea:  MMRC2 = can't walk a nl pace on a flat grade s sob but does fine slow and flat walks with wife 30 min 3 x weekly Cough: flared up x one week, just white mucus, better with mucinex  SABA use: as above  rec Prednisone 10 mg take  4 each am x 2 days,   2 each am x 2 days,  1 each am x 2 days and stop  The key is to stop  smoking completely before smoking completely stops you!    03/25/2018  f/u ov/Ethan Norton re: acute cough  Chief Complaint  Patient presents with  . Bacterial infection for last 2 weeks    Was given medication for treatment, but not progessing as fast as expected.  acute onset Jan 20th 2020 woke up with runny eyes/nose/ aching all over/ hacking cough > white mucus and more sob then having to double use albuterol helped so 3-4 days later PCP in [place] pred rx and nebs and levaquin > 60% improved with sense of chest congestion/rattling esp in am and hs  gen chest discomfort anteriorly with coughing fits, nothing localized  rec For cough > mucinex dm 1200 mg every 12 hours as needed Try prilosec otc 59m  Take 30-60 min before first meal of the day and Pepcid ac (famotidine) 20 mg one @  bedtime until cough is completely gone for at least a week without the need for cough suppression For stuffy head/ ears  > advil cold and sinus  Please remember to go to the  x-ray department  for your tests -  we will call you with the results when they are available   Plan A = Automatic = symbicort 160 Take 2 puffs first thing in am and then another 2 puffs about 12 hours later.  Plan B = Backup Only use your albuterol inhaler as a rescue medication  Plan C = Crisis - only use your albuterol nebulizer if you first try Plan B and it fails to help > ok to use the nebulizer up to every 4 hours but if start needing it regularly call for immediate appointment    04/12/2018  f/u ov/Ethan Norton re:  Copd ae/  Says last smoked before xmas  Chief Complaint  Patient presents with  . Follow-up    CXR repeated. Pt states still having wheezing, coughing and chest tightness. He is using his ventolin inhaler 8 x per day and neb at least 2 x per day  Dyspnea:  Any store shopping x 30 min gives out due to sob  Cough:  Worse when lie down and sporadically during the day, worse in am again x 1-2 tbsp mucoid  Sleeping: bed flat, 2  pillows/ rarely needs saba noct  SABA use: way over use as above  02: none  rec Only use your albuterol as a rescue medication  Prednisone 10 mg take  4 each am x 2 days,   2 each am x 2 days,  1 each am x 2 days and stop  For cough  > mucinex dm 1200 mg every 12 hours with tylenol # 3 up to every 4hours as needed       09/20/2018  f/u ov/Ethan Norton re: GOLD II / noct cough ? gerd Chief Complaint  Patient presents with  . Follow-up    Pt last seen in 03/2018, pt states his SOB has worsened slightly but contributes this to the summer heat. Pt c/o morning and night cough, clear to white mucus production. Pt denies CP/tightness, f/c/s. Pt states he is using his rescue inhaler twice a week.   Dyspnea:  Walking up to a mile outside moderate some hills s stopping  Cough: overall better unless mowing grass riding but still having min prod hs and am coughing /throat clearing   Sleeping: rarely wakes up with cough  SABA use: rarely at hs  02: none    No obvious day to day or daytime variability or assoc   purulent sputum or mucus plugs or hemoptysis or cp or chest tightness, subjective wheeze or overt sinus or hb symptoms.    Also denies any obvious fluctuation of symptoms with weather or environmental changes or other aggravating or alleviating factors except as outlined above   No unusual exposure hx or h/o childhood pna/ asthma or knowledge of premature birth.  Current Allergies, Complete Past Medical History, Past Surgical History, Family History, and Social History were reviewed in [employer] record.  ROS  The following are not active complaints unless bolded Hoarseness, sore throat, dysphagia, dental problems, itching, sneezing,  nasal congestion or discharge of excess mucus or purulent secretions, ear ache,   fever, chills, sweats, unintended wt loss or wt gain, classically pleuritic or exertional cp,  orthopnea pnd or arm/hand swelling  or leg swelling, presyncope,  palpitations, abdominal pain, anorexia, nausea, vomiting, diarrhea  or change in bowel habits or change in bladder habits, change in stools or change in urine, dysuria, hematuria,  rash, arthralgias, visual complaints, headache, numbness, weakness or ataxia or problems with walking or coordination,  change in  mood or  memory.        Current Meds  Medication Sig  . albuterol (PROVENTIL) (2.5 MG/3ML) 0.083% nebulizer solution Take 3 mLs (2.5 mg total) by nebulization every 4 (four) hours as needed for wheezing or shortness of breath.  Marland Kitchen albuterol (VENTOLIN HFA) 108 (90 Base) MCG/ACT inhaler Inhale 2 puffs into the lungs every 6 (six) hours as needed for wheezing or shortness of breath. Reported on 07/13/2015  . amLODipine (NORVASC) 5 MG tablet TAKE 1 TABLET BY MOUTH EVERY DAY  . budesonide-formoterol (SYMBICORT) 160-4.5 MCG/ACT inhaler TAKE 2 PUFFS BY MOUTH FIRST THING IN MORNING AND THEN 2 PUFFS ABOUT 12 HOURS LATER  . levocetirizine (XYZAL) 5 MG tablet Take 5 mg by mouth daily as needed.   . Multiple Vitamin (MULTIVITAMIN) tablet Take 1 tablet by mouth every evening.                 Objective:   Physical Exam   amb wm nad   09/20/2018         106  04/12/2018       111 03/25/2018         113 09/22/2017         113 03/25/2017         113  09/22/2016         122  03/24/2016         120  12/04/2015      111 10/23/2015         109  08/24/2015          109  07/13/2015        109   05/02/15 108 lb (48.988 kg)  11/09/07 112 lb 4.8 oz (50.939 kg)  10/12/07 106 lb 14.4 oz (48.49 kg)      Vital signs reviewed - Note on arrival 02 sats  95% on RA     HEENT: nl dentition / oropharynx.    NECK :  without JVD/Nodes/TM/ nl carotid upstrokes bilaterally   LUNGS: no acc muscle use,  Mild barrel  contour chest wall with bilateral  Distant bs s audible wheeze and  without cough on insp or exp maneuver and mild  Hyperresonant  to  percussion bilaterally     CV:  RRR  no s3 or murmur or increase in P2, and  no edema   ABD:  soft and nontender with pos end  insp Hoover's  in the supine position. No bruits or organomegaly appreciated, bowel sounds nl  MS:   Nl gait/  ext warm without deformities, calf tenderness, cyanosis or clubbing No obvious joint restrictions   SKIN: warm and dry without lesions    NEURO:  alert, approp, nl sensorium with  no motor or cerebellar deficits apparent.           Assessment & Plan:

## 2018-09-26 ENCOUNTER — Encounter: Payer: Self-pay | Admitting: Internal Medicine

## 2018-09-26 NOTE — Assessment & Plan Note (Addendum)
Quit smoking regularly 2015   smoking intermittently since then Spirometry 08/11/13   FEV1  1.67 / FVC 3.65 ratio 46%  05/02/2015  extensive coaching HFA effectiveness =    75% > try symbicort 160 2bid  - alpha one screening 05/02/2015 >  MM, levels ok - PFT's  07/13/2015  FEV1 1.97 (56 % ) ratio 55  p 21 % improvement from saba p symb 160 x 2 in am prior to study with DLCO  64/61 % corrects to 72 % for alv volume  - 07/13/2015  add trial of spiriva respimat > no improvement - 10/23/2015 pt preferred symbicort > bevespi so rec resume symbicort 160 2bid - 12/04/2015 referred to  rehab > 03/24/2016 referred again > declined due to cost - 03/25/2018  After extensive coaching inhaler device,  effectiveness =    90% saba   I had contemplated adding LAMA but the cough is his main problem at this point and may be more related to gerd so try ppi before supper for a month and if not better can add lama or the new symbicort-lama which should be out by next ov.   I had an extended discussion with the patient reviewing all relevant studies completed to date and  lasting 15 to 20 minutes of a 25 minute visit    Each maintenance medication was reviewed in detail including most importantly the difference between maintenance and prns and under what circumstances the prns are to be triggered using an action plan format that is not reflected in the computer generated alphabetically organized AVS.     Please see AVS for specific instructions unique to this visit that I personally wrote and verbalized to the the pt in detail and then reviewed with pt  by my nurse highlighting any  changes in therapy recommended at today's visit to their plan of care.

## 2018-10-13 ENCOUNTER — Other Ambulatory Visit: Payer: Self-pay | Admitting: Internal Medicine

## 2019-02-23 ENCOUNTER — Telehealth: Payer: Self-pay | Admitting: Internal Medicine

## 2019-02-23 NOTE — Telephone Encounter (Signed)
Attempted to call pt but unable to reach. Left message for pt to return call. 

## 2019-02-28 MED ORDER — ALBUTEROL SULFATE HFA 108 (90 BASE) MCG/ACT IN AERS: 2.0000 | INHALATION_SPRAY | Freq: Four times a day (QID) | RESPIRATORY_TRACT | 1 refills | Status: DC | PRN

## 2019-02-28 NOTE — Telephone Encounter (Signed)
Albuterol hfa has been sent to [facility] in [place] as requested. Left detailed message for to call back if anything further is needed. Will sign off.

## 2019-03-24 ENCOUNTER — Other Ambulatory Visit: Payer: Self-pay

## 2019-03-24 ENCOUNTER — Ambulatory Visit (INDEPENDENT_AMBULATORY_CARE_PROVIDER_SITE_OTHER): Payer: Federal, State, Local not specified - PPO

## 2019-03-24 ENCOUNTER — Ambulatory Visit: Payer: Federal, State, Local not specified - PPO | Admitting: Internal Medicine

## 2019-03-24 ENCOUNTER — Encounter: Payer: Self-pay | Admitting: Internal Medicine

## 2019-03-24 DIAGNOSIS — J449 Chronic obstructive pulmonary disease, unspecified: Secondary | ICD-10-CM

## 2019-03-24 DIAGNOSIS — R0789 Other chest pain: Secondary | ICD-10-CM | POA: Diagnosis not present

## 2019-03-24 MED ORDER — BREZTRI AEROSPHERE 160-9-4.8 MCG/ACT IN AERO: 2.0000 | INHALATION_SPRAY | Freq: Two times a day (BID) | RESPIRATORY_TRACT | 11 refills | Status: DC

## 2019-03-24 MED ORDER — BREZTRI AEROSPHERE 160-9-4.8 MCG/ACT IN AERO: 2.0000 | INHALATION_SPRAY | Freq: Two times a day (BID) | RESPIRATORY_TRACT | 0 refills | Status: DC

## 2019-03-24 NOTE — Progress Notes (Signed)
LMTCB

## 2019-03-24 NOTE — Patient Instructions (Signed)
Try albuterol 15 min before an activity (on alternate days) that you know would make you short of breath and see if it makes any difference and if makes none then don't take it after activity unless you can't catch your breath.  Breztri Take 2 puffs first thing in am and then another 2 puffs about 12 hours later.    Please schedule a follow up visit in 6  months but call sooner if needed

## 2019-03-24 NOTE — Progress Notes (Signed)
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 Pulmonary Consult    Self referral for COPD. Pt states he went to [facility] for admission in 2015 and was told he has COPD. Pt c/o DOE, mild cough with little mucus production - clear in color. Pt c/o left sided chest tightness when SOB and occasional feels has sharp pain.   episodic flares x 5 years and need for albuterol varies but has not used in the last week prior to OV  While of advair 250 one bid  First thing in am is tough and more than one flight of steps has to stop at top  Also with his wife up and and down street up to a mile slower than wife's pace rec Plan A = Automatic = Symbicort 160 Take 2 puffs first thing in am and then another 2 puffs about 12 hours later.  Plan B = Backup Only use your albuterol (ventolin)  Please schedule a follow up office visit in 6 weeks, call sooner if needed with pfts ok push back if needed       09/22/2017  f/u ov/[facility] re:  COPD  GOLD II  Chief Complaint  Patient presents with  . Follow-up    Breathing is "not too bad" No new co's.  He is using his albuterol inhaler 2-3 x per wk on average.    Dyspnea:  MMRC2 = can't walk a nl pace on a flat grade s sob but does fine slow and flat walks with wife 30 min 3 x weekly Cough: flared up x one week, just white mucus, better with mucinex  SABA use: as above  rec Prednisone 10 mg take  4 each am x 2 days,   2 each am x 2 days,  1 each am x 2 days and stop  The key is to stop  smoking completely before smoking completely stops you!    03/25/2018  f/u ov/[name] re: acute cough  Chief Complaint  Patient presents with  . Bacterial infection for last 2 weeks    Was given medication for treatment, but not progessing as fast as expected.  acute onset Jan 20th 2020 woke up with runny eyes/nose/ aching all over/ hacking cough > white mucus and more sob then having to double use albuterol helped so 3-4 days later PCP in [place] pred rx and nebs and levaquin > 60% improved with sense of chest congestion/rattling esp in am and hs  gen chest discomfort anteriorly with coughing fits, nothing localized  rec For cough > mucinex dm 1200 mg every 12 hours as needed Try prilosec otc 40m  Take 30-60 min before first meal of the day and Pepcid ac (famotidine) 20 mg one @  bedtime until cough is completely gone for at least a week without the need for cough suppression For stuffy head/ ears  > advil cold and sinus  Please remember to go to the  x-ray department  for your tests -  we will call you with the results when they are available   Plan A = Automatic = symbicort 160 Take 2 puffs first thing in am and then another 2 puffs about 12 hours later.  Plan B = Backup Only use your albuterol inhaler as a rescue medication  Plan C = Crisis - only use your albuterol nebulizer if you first try Plan B and it fails to help > ok to use the nebulizer up to every 4 hours but if start needing it regularly call for immediate appointment    04/12/2018  f/u ov/[name] re:  Copd ae/  Says last smoked before xmas  Chief Complaint  Patient presents with  . Follow-up    CXR repeated. Pt states still having wheezing, coughing and chest tightness. He is using his ventolin inhaler 8 x per day and neb at least 2 x per day  Dyspnea:  Any store shopping x 30 min gives out due to sob  Cough:  Worse when lie down and sporadically during the day, worse in am again x 1-2 tbsp mucoid  Sleeping: bed flat, 2  pillows/ rarely needs saba noct  SABA use: way over use as above  02: none  rec Only use your albuterol as a rescue medication  Prednisone 10 mg take  4 each am x 2 days,   2 each am x 2 days,  1 each am x 2 days and stop  For cough  > mucinex dm 1200 mg every 12 hours with tylenol # 3 up to every 4hours as needed       09/20/2018  f/u ov/[name] re: GOLD II / noct cough ? gerd Chief Complaint  Patient presents with  . Follow-up    Pt last seen in 03/2018, pt states his SOB has worsened slightly but contributes this to the summer heat. Pt c/o morning and night cough, clear to white mucus production. Pt denies CP/tightness, f/c/s. Pt states he is using his rescue inhaler twice a week.   Dyspnea:  Walking up to a mile outside moderate some hills s stopping  Cough: overall better unless mowing grass riding but still having min prod hs and am coughing /throat clearing   Sleeping: rarely wakes up with cough  SABA use: rarely at hs  02: none  rec Prilosec before supper / gerd diet    03/24/2019  f/u ov/[name] re:  GOLD II / new R CP Chief Complaint  Patient presents with  . Follow-up    Breathing is "fairly good"- he had sharp right CP that lasted approx 1 wk- started 2 wks ago- still faintly present. He uses his albuterol inhaler about 10 x per wk on average. He has not used neb recently.   Dyspnea:   Chasing kids around  Including up steps tol ok  Cough: some increased cough then one week  prior to OV>>  acute  R lat cp ? Mscp, no fever, mostly white mucus esp in am gradually improved cp since onset but still hurts some with deep breath. Sleeping: ok flat/ 2-3 pillows  SABA use: as above  02: none    No obvious day to day or daytime variability or assoc excess/ purulent sputum or mucus plugs or hemoptysis or   chest tightness, subjective wheeze or overt sinus or hb symptoms.   Sleeping as above  without nocturnal  or early am exacerbation  of respiratory  c/o's or need for noct saba. Also  denies any obvious fluctuation of  symptoms with weather or environmental changes or other aggravating or alleviating factors except as outlined above   No unusual exposure hx or h/o childhood pna/ asthma or knowledge of premature birth.  Current Allergies, Complete Past Medical History, Past Surgical History, Family History, and Social History were reviewed in [employer] record.  ROS  The following are not active complaints unless bolded Hoarseness, sore throat, dysphagia, dental problems, itching, sneezing,  nasal congestion or discharge of excess mucus or purulent secretions, ear ache,   fever, chills, sweats, unintended wt loss or wt gain, classically  exertional cp,  orthopnea pnd or arm/hand swelling  or leg swelling, presyncope, palpitations, abdominal pain, anorexia, nausea, vomiting, diarrhea  or change in bowel habits or change in bladder habits, change in stools or change in urine, dysuria, hematuria,  rash, arthralgias, visual complaints, headache, numbness, weakness or ataxia or problems with walking or coordination,  change in mood or  memory.        Current Meds  Medication Sig  . albuterol (PROVENTIL) (2.5 MG/3ML) 0.083% nebulizer solution Take 3 mLs (2.5 mg total) by nebulization every 4 (four) hours as needed for wheezing or shortness of breath.  Marland Kitchen albuterol (VENTOLIN HFA) 108 (90 Base) MCG/ACT inhaler Inhale 2 puffs into the lungs every 6 (six) hours as needed for wheezing or shortness of breath. Reported on 07/13/2015  . amLODipine (NORVASC) 5 MG tablet TAKE 1 TABLET BY MOUTH EVERY DAY  . budesonide-formoterol (SYMBICORT) 160-4.5 MCG/ACT inhaler TAKE 2 PUFFS BY MOUTH FIRST THING IN MORNING AND THEN 2 PUFFS ABOUT 12 HOURS LATER  . levocetirizine (XYZAL) 5 MG tablet Take 5 mg by mouth daily as needed.   . Multiple Vitamin (MULTIVITAMIN) tablet Take 1 tablet by mouth every evening.   .                   Objective:   Physical Exam   amb somber wm  nad   03/24/2019      09/20/2018         106  04/12/2018       111 03/25/2018         113 09/22/2017         113 03/25/2017         113  09/22/2016         122  03/24/2016         120  12/04/2015      111 10/23/2015         109  08/24/2015          109  07/13/2015        109   05/02/15 108 lb (48.988 kg)  11/09/07 112 lb 4.8 oz (50.939 kg)  10/12/07 106 lb 14.4 oz (48.49 kg)      Vital signs reviewed  03/24/2019  - Note at rest 02 sats  95% on RA     HEENT : pt wearing mask not removed for exam due to covid - 19 concerns.    NECK :  without JVD/Nodes/TM/ nl carotid upstrokes bilaterally   LUNGS: no acc muscle use,  Mild barrel  contour chest wall with bilateral  Distant bs s audible wheeze and  without cough on insp or exp maneuvers  and mild  Hyperresonant  to  percussion bilaterally     CV:  RRR  no s3 or murmur or increase in P2, and no edema   ABD:  soft and nontender with pos end  insp Hoover's  in the supine position. No bruits or organomegaly appreciated, bowel sounds nl  MS:   Nl gait/  ext warm without deformities, calf tenderness, cyanosis or clubbing No obvious joint restrictions   SKIN: warm and dry without lesions    NEURO:  alert, approp, nl sensorium with  no motor or cerebellar deficits apparent.        CXR PA and Lateral:   03/24/2019 :    I personally reviewed images and agree with radiology impression as follows:    1. Stable emphysema.  No acute process.    Assessment & Plan:

## 2019-03-25 ENCOUNTER — Encounter: Payer: Self-pay | Admitting: Internal Medicine

## 2019-03-25 NOTE — Assessment & Plan Note (Signed)
Onset x 2011 / c/w splenic flexure  - CTchest c contrast 07/09/13 > neg  - 12/04/2015 reported better with rx for IBS  - reucurred on R p coughing Jan 2021 > c/w mscp   On this occasion the pain has a more musculoskeletal pattern and that it came on with coughing and is gradually dissipated.  There is no evidence of infiltrate or effusion on x-ray.  Patient was assured that this is the typical course for musculoskeletal chest pain from coughing and no further work-up needed.  However, should it recur I would consider repeating the irritable bowel regimen..  Medical decision making was a moderate level of complexity in this case because of one acute/one chronic conditions /diagnoses requiring extra time for  H and P, chart review, counseling,I performed device teaching  using a teach back technique which also  extended face to face time for this visit (see above)  and generating customized AVS unique to this office visit and charting.   Each maintenance medication was reviewed in detail including emphasizing most importantly the difference between maintenance and prns and under what circumstances the prns are to be triggered using an action plan format where appropriate. Please see avs for details which were reviewed in writing by both me and my nurse and patient given a written copy highlighted where appropriate with yellow highlighter for the patient's continued care at home along with an updated version of their medications.  Patient was asked to maintain medication reconciliation by comparing this list to the actual medications being used at home and to contact this office right away if there is a conflict or discrepancy.

## 2019-03-25 NOTE — Assessment & Plan Note (Signed)
Quit smoking regularly 2015   smoking intermittently since then Spirometry 08/11/13   FEV1  1.67 / FVC 3.65 ratio 46%  05/02/2015  extensive coaching HFA effectiveness =    75% > try symbicort 160 2bid  - alpha one screening 05/02/2015 >  MM, levels ok - PFT's  07/13/2015  FEV1 1.97 (56 % ) ratio 55  p 21 % improvement from saba p symb 160 x2 in am prior to study with DLCO  64/61 % corrects to 72 % for alv volume  - 07/13/2015  add trial of spiriva respimat > no improvement - 10/23/2015 pt preferred symbicort > bevespi so rec resume symbicort 160 2bid - 12/04/2015 referred to  rehab > 03/24/2016 referred again > declined due to cost - 03/24/2019  After extensive coaching inhaler device,  effectiveness =    75% > try breztri  2bid  Pt has evolved :  Group D in terms of symptom/risk and laba/lama/ICS  therefore appropriate rx at this point >>>  Change to breztri 2 bid if insurance covers  Pt informed of the seriousness of COVID 19 infection as a direct risk to lung health  and safey and to close contacts and should continue to wear a facemask in public and minimize exposure to public locations but especially avoid any area or activity where non-close contacts are not observing distancing or wearing an appropriate face mask.  I strongly recommended vaccine when offered.

## 2019-03-31 NOTE — Progress Notes (Signed)
LMTCB

## 2019-04-01 ENCOUNTER — Encounter: Payer: Self-pay | Admitting: Internal Medicine

## 2019-04-01 NOTE — Progress Notes (Signed)
Letter mailed to the pt to call for results

## 2019-04-28 DIAGNOSIS — Z23 Encounter for immunization: Secondary | ICD-10-CM | POA: Diagnosis not present

## 2019-05-28 DIAGNOSIS — Z23 Encounter for immunization: Secondary | ICD-10-CM | POA: Diagnosis not present

## 2019-07-27 ENCOUNTER — Other Ambulatory Visit: Payer: Self-pay | Admitting: Internal Medicine

## 2019-07-27 MED ORDER — AMLODIPINE BESYLATE 5 MG PO TABS: 5.0000 mg | ORAL_TABLET | Freq: Every day | ORAL | 0 refills | Status: DC

## 2019-08-25 DIAGNOSIS — D692 Other nonthrombocytopenic purpura: Secondary | ICD-10-CM | POA: Diagnosis not present

## 2019-09-21 ENCOUNTER — Ambulatory Visit: Payer: Federal, State, Local not specified - PPO | Admitting: Internal Medicine

## 2019-09-26 ENCOUNTER — Other Ambulatory Visit: Payer: Self-pay | Admitting: Internal Medicine

## 2019-10-24 ENCOUNTER — Other Ambulatory Visit: Payer: Self-pay | Admitting: Internal Medicine

## 2019-10-26 ENCOUNTER — Other Ambulatory Visit: Payer: Self-pay | Admitting: Internal Medicine

## 2019-11-14 ENCOUNTER — Other Ambulatory Visit: Payer: Self-pay

## 2019-11-14 ENCOUNTER — Encounter: Payer: Self-pay | Admitting: Internal Medicine

## 2019-11-14 ENCOUNTER — Ambulatory Visit (INDEPENDENT_AMBULATORY_CARE_PROVIDER_SITE_OTHER): Payer: Federal, State, Local not specified - PPO | Admitting: Internal Medicine

## 2019-11-14 DIAGNOSIS — J449 Chronic obstructive pulmonary disease, unspecified: Secondary | ICD-10-CM | POA: Diagnosis not present

## 2019-11-14 NOTE — Patient Instructions (Addendum)
Brush teeth and tongue with arm and hammer toothpaste immediately after use of breztri     Plan B = Backup (to supplement plan A, not to replace it) Only use your albuterol inhaler as a rescue medication to be used if you can't catch your breath by resting or doing a relaxed purse lip breathing pattern.  - The less you use it, the better it will work when you need it. - Ok to use the inhaler up to 2 puffs  every 4 hours if you must but call for appointment if use goes up over your usual need - Don't leave home without it !!  (think of it like the spare tire for your car)   Plan C = Crisis (instead of Plan B but only if Plan B stops working) - only use your albuterol nebulizer if you first try Plan B and it fails to help > ok to use the nebulizer up to every 4 hours but if start needing it regularly call for immediate appointment  Ok to  Occasionally try albuterol 15 min before an activity that you know would make you short of breath and see if it makes any difference and if makes none then don't take it after activity unless you can't catch your breath.      Ok to get the [place] booster in October as you have a chronic respiratory disease though stay tuned for the latest recommendations as moderna benefit may last a bit longer and ok to wait a few more months in that case.    Please schedule a follow up visit in 6  months but call sooner if needed

## 2019-11-14 NOTE — Assessment & Plan Note (Signed)
Quit smoking regularly 2015   smoking intermittently since then Spirometry 08/11/13   FEV1  1.67 / FVC 3.65 ratio 46%  05/02/2015  extensive coaching HFA effectiveness =    75% > try symbicort 160 2bid  - alpha one screening 05/02/2015 >  MM, levels ok - PFT's  07/13/2015  FEV1 1.97 (56 % ) ratio 55  p 21 % improvement from saba p symb 160 x2 in am prior to study with DLCO  64/61 % corrects to 72 % for alv volume  - 07/13/2015  add trial of spiriva respimat > no improvement - 10/23/2015 pt preferred symbicort > bevespi so rec resume symbicort 160 2bid - 12/04/2015 referred to  rehab > 03/24/2016 referred again > declined due to cost - 03/24/2019  After extensive coaching inhaler device,  effectiveness =    75% > try breztri  2bid   Group D in terms of symptom/risk and laba/lama/ICS  therefore appropriate rx at this point >>>  Continue breztri and use arm and hammer to lessen upper airway irritation or go back to symbicort 160 if persists  Re saba use:  I spent extra time with pt today reviewing appropriate use of albuterol for prn use on exertion with the following points: 1) saba is for relief of sob that does not improve by walking a slower pace or resting but rather if the pt does not improve after trying this first. 2) If the pt is convinced, as many are, that saba helps recover from activity faster then it's easy to tell if this is the case by re-challenging : ie stop, take the inhaler, then p 5 minutes try the exact same activity (intensity of workload) that just caused the symptoms and see if they are substantially diminished or not after saba 3) if there is an activity that reproducibly causes the symptoms, try the saba 15 min before the activity on alternate days   If in fact the saba really does help, then fine to continue to use it prn but advised may need to look closer at the maintenance regimen being used to achieve better control of airways disease with exertion.   F/ u  q 6 m   Each  maintenance medication was reviewed in detail including most importantly the difference between maintenance and as needed and under what circumstances the prns are to be used.  Please see AVS for specific  Instructions which are unique to this visit and I personally typed out  which were reviewed in detail over the phone with the patient and a copy provided via My Chart

## 2019-11-14 NOTE — Progress Notes (Signed)
Mandi, [place] speaking with patient about tele visit today due to patient report of positive Covid test of someone he lives with and patient with reports of active coughing and shortness of breath today. Dr. Melvyn Novas is notified of all and VORV Dr. Melvyn Novas, patient to go home and no office visit today. Will attempt Tele visit.

## 2019-11-14 NOTE — Progress Notes (Signed)
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 Pulmonary Consult    Self referral for COPD. Pt states he went to [facility] for admission in 2015 and was told he has COPD. Pt c/o DOE, mild cough with little mucus production - clear in color. Pt c/o left sided chest tightness when SOB and occasional feels has sharp pain.   episodic flares x 5 years and need for albuterol varies but has not used in the last week prior to OV  While of advair 250 one bid  First thing in am is tough and more than one flight of steps has to stop at top  Also with his wife up and and down street up to a mile slower than wife's pace rec Plan A = Automatic = Symbicort 160 Take 2 puffs first thing in am and then another 2 puffs about 12 hours later.  Plan B = Backup Only use your albuterol (ventolin)  Please schedule a follow up office visit in 6 weeks, call sooner if needed with pfts ok push back if needed       09/22/2017  f/u ov/Ethan Norton re:  COPD  GOLD II  Chief Complaint  Patient presents with  . Follow-up    Breathing is "not too bad" No new co's.  He is using his albuterol inhaler 2-3 x per wk on average.    Dyspnea:  MMRC2 = can't walk a nl pace on a flat grade s sob but does fine slow and flat walks with wife 30 min 3 x weekly Cough: flared up x one week, just white mucus, better with mucinex  SABA use: as above  rec Prednisone 10 mg take  4 each am x 2 days,   2 each am x 2 days,  1 each am x 2 days and stop  The key is to stop  smoking completely before smoking completely stops you!    03/25/2018  f/u ov/Ethan Norton re: acute cough  Chief Complaint  Patient presents with  . Bacterial infection for last 2 weeks    Was given medication for treatment, but not progessing as fast as expected.  acute onset Jan 20th 2020 woke up with runny eyes/nose/ aching all over/ hacking cough > white mucus and more sob then having to double use albuterol helped so 3-4 days later PCP in [place] pred rx and nebs and levaquin > 60% improved with sense of chest congestion/rattling esp in am and hs  gen chest discomfort anteriorly with coughing fits, nothing localized  rec For cough > mucinex dm 1200 mg every 12 hours as needed Try prilosec otc 43m  Take 30-60 min before first meal of the day and Pepcid ac (famotidine) 20 mg one @  bedtime until cough is completely gone for at least a week without the need for cough suppression For stuffy head/ ears  > advil cold and sinus  Please remember to go to the  x-ray department  for your tests -  we will call you with the results when they are available   Plan A = Automatic = symbicort 160 Take 2 puffs first thing in am and then another 2 puffs about 12 hours later.  Plan B = Backup Only use your albuterol inhaler as a rescue medication  Plan C = Crisis - only use your albuterol nebulizer if you first try Plan B and it fails to help > ok to use the nebulizer up to every 4 hours but if start needing it regularly call for immediate appointment    04/12/2018  f/u ov/Ethan Norton re:  Copd ae/  Says last smoked before xmas  Chief Complaint  Patient presents with  . Follow-up    CXR repeated. Pt states still having wheezing, coughing and chest tightness. He is using his ventolin inhaler 8 x per day and neb at least 2 x per day  Dyspnea:  Any store shopping x 30 min gives out due to sob  Cough:  Worse when lie down and sporadically during the day, worse in am again x 1-2 tbsp mucoid  Sleeping: bed flat, 2  pillows/ rarely needs saba noct  SABA use: way over use as above  02: none  rec Only use your albuterol as a rescue medication  Prednisone 10 mg take  4 each am x 2 days,   2 each am x 2 days,  1 each am x 2 days and stop  For cough  > mucinex dm 1200 mg every 12 hours with tylenol # 3 up to every 4hours as needed       09/20/2018  f/u ov/Ethan Norton re: GOLD II / noct cough ? gerd Chief Complaint  Patient presents with  . Follow-up    Pt last seen in 03/2018, pt states his SOB has worsened slightly but contributes this to the summer heat. Pt c/o morning and night cough, clear to white mucus production. Pt denies CP/tightness, f/c/s. Pt states he is using his rescue inhaler twice a week.   Dyspnea:  Walking up to a mile outside moderate some hills s stopping  Cough: overall better unless mowing grass riding but still having min prod hs and am coughing /throat clearing   Sleeping: rarely wakes up with cough  SABA use: rarely at hs  02: none  rec Prilosec before supper / gerd diet    03/24/2019  f/u ov/Ethan Norton re:  GOLD II / new R CP Chief Complaint  Patient presents with  . Follow-up    Breathing is "fairly good"- he had sharp right CP that lasted approx 1 wk- started 2 wks ago- still faintly present. He uses his albuterol inhaler about 10 x per wk on average. He has not used neb recently.   Dyspnea:   Chasing kids around  Including up steps tol ok  Cough: some increased cough then one week  prior to OV>>  acute  R lat cp ? Mscp, no fever, mostly white mucus esp in am gradually improved cp since onset but still hurts some with deep breath. Sleeping: ok flat/ 2-3 pillows  SABA use: as above  02: none  rec Try albuterol 15 min before an activity (on alternate days) that you know would make you short of breath and see if it makes any difference and if makes none then don't take it after activity unless you can't catch your breath. Breztri Take 2 puffs first thing in am and then another 2 puffs  about 12 hours later.  Virtual Visit via Telephone Note 11/14/2019  S/p moderna April  2021   I connected with Ethan Norton on 11/14/19 at 11:20 AM EDT by telephone and verified that I am speaking with the correct person using two identifiers. Pt is at home and this call made from my office with no other participants    I discussed the limitations, risks, security and privacy concerns of performing an evaluation and management service by telephone and the availability of in person appointments. I also discussed with the patient that there may be a patient responsible charge related to this service. The patient expressed understanding and agreed to proceed.   History of Present Illness: Dyspnea:  Still able to chase kids/ walks 30 min around neighborhood several times  Cough: some hs and some congestion x one hour no change baseline mucoid x sev tsp Sleeping: able to flat but 2-3 pillows SABA use: hfa maybe twice a week /no neb / needs more when over does it but never rechallenges  02: none  Some scratch throat last sev months  ? From breztri    No obvious day to day or daytime variability or assoc  purulent sputum or mucus plugs or hemoptysis or cp or chest tightness, subjective wheeze or overt sinus or hb symptoms.    Also denies any obvious fluctuation of symptoms with weather or environmental changes or other aggravating or alleviating factors except as outlined above.   Meds reviewed/ med reconciliation completed     No outpatient medications have been marked as taking for the 11/14/19 encounter (Office Visit) with Tanda Rockers, MD.         Observations/Objective: Full sentences/ no spont coughing / good voice texture      Assessment and Plan: See problem list for active a/p's   Follow Up Instructions: See avs for instructions unique to this ov which includes revised/ updated med list    Outpatient Encounter Medications as of 11/14/2019  Medication Sig  .  albuterol (PROVENTIL) (2.5 MG/3ML) 0.083% nebulizer solution Take 3 mLs (2.5 mg total) by nebulization every 4 (four) hours as needed for wheezing or shortness of breath.  Marland Kitchen albuterol (VENTOLIN HFA) 108 (90 Base) MCG/ACT inhaler INHALE 2 PUFFS INTO THE LUNGS EVERY 6 HOURS AS NEEDED FOR WHEEZING OR SHORTNESS OF BREATH  . amLODipine (NORVASC) 5 MG tablet TAKE 1 TABLET(5 MG) BY MOUTH DAILY  . Budeson-Glycopyrrol-Formoterol (BREZTRI AEROSPHERE) 160-9-4.8 MCG/ACT AERO Inhale 2 puffs into the lungs 2 (two) times daily.  Marland Kitchen levocetirizine (XYZAL) 5 MG tablet Take 5 mg by mouth daily as needed.   . Multiple Vitamin (MULTIVITAMIN) tablet Take 1 tablet by mouth every evening.         I discussed the assessment and treatment plan with the patient. The patient was provided an opportunity to ask questions and all were answered. The patient agreed with the plan and demonstrated an understanding of the instructions.   The patient was advised to call back or seek an in-person evaluation if the symptoms worsen or if the condition fails to improve as anticipated.  I provided 25 minutes of non-face-to-face time during this encounter.   Christinia Gully, MD

## 2019-11-23 ENCOUNTER — Telehealth: Payer: Self-pay | Admitting: Internal Medicine

## 2019-11-23 DIAGNOSIS — J449 Chronic obstructive pulmonary disease, unspecified: Secondary | ICD-10-CM

## 2019-11-23 MED ORDER — BUDESONIDE-FORMOTEROL FUMARATE 160-4.5 MCG/ACT IN AERO: 2.0000 | INHALATION_SPRAY | Freq: Two times a day (BID) | RESPIRATORY_TRACT | 6 refills | Status: DC

## 2019-11-23 NOTE — Telephone Encounter (Signed)
Pt was informed of Dr. Melvyn Novas approval on switching to Symbicort. Symbicort order was placed and rx was sent to [facility]. Pt stated understanding. Nothing further needed at this time.

## 2019-11-23 NOTE — Telephone Encounter (Signed)
Spoke with pt who states currently taking Breztri. He states that he is having side effects from the [place] and would like to switch back to Symbicort.   Dr. Melvyn Novas please advise. Thank you

## 2019-11-23 NOTE — Telephone Encounter (Signed)
Fine with me  160 Take 2 puffs first thing in am and then another 2 puffs about 12 hours later.

## 2019-12-09 ENCOUNTER — Other Ambulatory Visit: Payer: Self-pay | Admitting: Internal Medicine

## 2020-01-27 DIAGNOSIS — K080 Exfoliation of teeth due to systemic causes: Secondary | ICD-10-CM | POA: Diagnosis not present

## 2020-03-09 ENCOUNTER — Other Ambulatory Visit: Payer: Self-pay | Admitting: Internal Medicine

## 2020-03-09 MED ORDER — ALBUTEROL SULFATE HFA 108 (90 BASE) MCG/ACT IN AERS
INHALATION_SPRAY | RESPIRATORY_TRACT | 2 refills | Status: DC
Start: 2020-03-09 — End: 2020-05-17

## 2020-05-17 ENCOUNTER — Other Ambulatory Visit: Payer: Self-pay | Admitting: Internal Medicine

## 2020-06-20 ENCOUNTER — Other Ambulatory Visit: Payer: Self-pay | Admitting: Internal Medicine

## 2020-07-19 ENCOUNTER — Other Ambulatory Visit: Payer: Self-pay | Admitting: Internal Medicine

## 2020-07-19 NOTE — Telephone Encounter (Signed)
AMLODIPINE BESYLATE 5MG TABLETS Is being requested, would you like to refill this or would you like his PCP to do so?

## 2020-10-02 ENCOUNTER — Other Ambulatory Visit: Payer: Self-pay | Admitting: Internal Medicine

## 2020-10-18 ENCOUNTER — Other Ambulatory Visit: Payer: Self-pay | Admitting: Internal Medicine

## 2020-11-07 ENCOUNTER — Other Ambulatory Visit: Payer: Self-pay

## 2020-11-07 ENCOUNTER — Encounter: Payer: Self-pay | Admitting: Internal Medicine

## 2020-11-07 ENCOUNTER — Ambulatory Visit: Payer: Federal, State, Local not specified - PPO | Admitting: Internal Medicine

## 2020-11-07 VITALS — BP 118/72 | HR 64 | Temp 98.0°F | Ht 66.0 in | Wt 112.1 lb

## 2020-11-07 DIAGNOSIS — I10 Essential (primary) hypertension: Secondary | ICD-10-CM

## 2020-11-07 DIAGNOSIS — J449 Chronic obstructive pulmonary disease, unspecified: Secondary | ICD-10-CM | POA: Diagnosis not present

## 2020-11-07 DIAGNOSIS — F17210 Nicotine dependence, cigarettes, uncomplicated: Secondary | ICD-10-CM

## 2020-11-07 DIAGNOSIS — M7989 Other specified soft tissue disorders: Secondary | ICD-10-CM | POA: Diagnosis not present

## 2020-11-07 DIAGNOSIS — Z23 Encounter for immunization: Secondary | ICD-10-CM

## 2020-11-07 DIAGNOSIS — D751 Secondary polycythemia: Secondary | ICD-10-CM

## 2020-11-07 MED ORDER — TELMISARTAN 40 MG PO TABS
40.0000 mg | ORAL_TABLET | Freq: Every day | ORAL | 11 refills | Status: AC
Start: 2020-11-07 — End: ?

## 2020-11-07 NOTE — Patient Instructions (Addendum)
We will contact you about the lung cancer screening program   I recommend this clinic for your hypertension and stop amlopdipine and avoid salt  Start telmisartan  40 mg one  daily in its place goal is 135/80 or less    Please remember to go to the lab department   for your tests - we will call you with the results when they are available.      Please schedule a follow up visit in 12 months but call sooner if needed

## 2020-11-07 NOTE — Progress Notes (Signed)
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 Pt states he went to [facility] for admission in 2015 and was told he has COPD. Pt c/o DOE, mild cough with little mucus production - clear in color. Pt c/o left sided chest tightness when SOB and occasional feels has sharp pain.   episodic flares x 5 years and need for albuterol varies but has not used in the last week prior to OV  While of advair 250 one bid  First thing in am is tough and more than one flight of steps has to stop at top  Also with his wife up and and down street up to a mile slower than wife's pace rec Plan A = Automatic = Symbicort 160 Take 2 puffs first thing in am and then another 2 puffs about 12 hours later.  Plan B = Backup Only use your albuterol (ventolin)  Please schedule a follow up office visit in 6 weeks, call sooner if needed with pfts ok push back if needed       09/22/2017  f/u ov/Ethan Norton re:  COPD  GOLD II  Chief Complaint  Patient presents with   Follow-up    Breathing is "not too bad" No new co's.  He is using his albuterol inhaler 2-3 x per wk on average.    Dyspnea:  MMRC2 = can't walk a nl pace on a flat grade s sob but does fine slow and flat walks with wife 30 min 3 x weekly Cough: flared up x one week, just white mucus, better with mucinex  SABA use: as above  rec Prednisone 10 mg take  4 each am x 2 days,   2 each am x 2 days,  1 each am x 2 days and stop  The key is to stop  smoking completely before smoking completely stops you!    03/25/2018  f/u ov/Ethan Norton re: acute cough  Chief Complaint  Patient presents with   Bacterial infection for last 2 weeks    Was given medication for treatment, but not progessing as fast as expected.  acute onset Jan 20th 2020 woke up with runny eyes/nose/ aching all over/ hacking cough > white mucus and more sob then having to double use albuterol helped so 3-4 days later PCP in [place] pred rx and nebs and levaquin > 60% improved with sense of chest congestion/rattling esp in am and hs  gen chest discomfort anteriorly with coughing fits, nothing localized  rec For cough > mucinex dm 1200 mg every 12 hours as needed Try prilosec otc 58m  Take 30-60 min before first meal of the day and Pepcid ac (famotidine) 20 mg one @  bedtime until cough is completely gone for at least a week without the need for cough suppression For stuffy head/ ears  > advil cold and sinus  Please remember to go to the  x-ray department  for your tests -  we will call you with the results when they are available   Plan A = Automatic = symbicort 160 Take 2 puffs first thing in am and then another 2 puffs about 12 hours later.  Plan B = Backup Only use your albuterol inhaler as a rescue medication  Plan C = Crisis - only use your albuterol nebulizer if you first try Plan B and it fails to help > ok to use the nebulizer up to every 4 hours but if start needing it regularly call for immediate appointment    04/12/2018  f/u ov/Ethan Norton re:  Copd ae/  Says last smoked before xmas  Chief Complaint  Patient presents with   Follow-up    CXR repeated. Pt states still having wheezing, coughing and chest tightness. He is using his ventolin inhaler 8 x per day and neb at least 2 x per day  Dyspnea:  Any store shopping x 30 min gives out due to sob  Cough:  Worse when lie down and sporadically during the day, worse in am again x 1-2 tbsp mucoid  Sleeping: bed flat, 2 pillows/  rarely needs saba noct  SABA use: way over use as above  02: none  rec Only use your albuterol as a rescue medication  Prednisone 10 mg take  4 each am x 2 days,   2 each am x 2 days,  1 each am x 2 days and stop  For cough  > mucinex dm 1200 mg every 12 hours with tylenol # 3 up to every 4hours as needed       09/20/2018  f/u ov/Ethan Norton re: GOLD II / noct cough ? gerd Chief Complaint  Patient presents with   Follow-up    Pt last seen in 03/2018, pt states his SOB has worsened slightly but contributes this to the summer heat. Pt c/o morning and night cough, clear to white mucus production. Pt denies CP/tightness, f/c/s. Pt states he is using his rescue inhaler twice a week.   Dyspnea:  Walking up to a mile outside moderate some hills s stopping  Cough: overall better unless mowing grass riding but still having min prod hs and am coughing /throat clearing   Sleeping: rarely wakes up with cough  SABA use: rarely at hs  02: none  rec Prilosec before supper / gerd diet    03/24/2019  f/u ov/Ethan Norton re:  GOLD II / new R CP Chief Complaint  Patient presents with   Follow-up    Breathing is "fairly good"- he had sharp right CP that lasted approx 1 wk- started 2 wks ago- still faintly present. He uses his albuterol inhaler about 10 x per wk on average. He has not used neb recently.   Dyspnea:   Chasing kids around  Including up steps tol ok  Cough: some increased cough then one week  prior to OV>>  acute  R lat cp ? Mscp, no fever, mostly white mucus esp in am gradually improved cp since onset but still hurts some with deep breath. Sleeping: ok flat/ 2-3 pillows  SABA use: as above  02: none  Rec Try albuterol 15 min before an activity (on alternate days) that you know would make you short of breath   Breztri Take 2 puffs first thing in am and then another 2 puffs about 12 hours later.    11/07/2020  f/u ov/[place] office/Ethan Norton re: GOLD 2 copd maint on symbicort 160 2bid  as could not tol  mouth  irritation with breztri  Chief Complaint  Patient presents with   Follow-up    Pt states that he has not noticed any change in sob or cough. It was worse over summer. But feels back at baseline    Dyspnea:  as long as not hot > walking 30 min 3-4 x per weeks  Cough: not much  Sleeping: bed flat/ pillows  SABA use: rarely  02: none  Covid status: vax x 2 / never infected  Leg swelling new x one month on norvasc    No obvious day to day or daytime variability or assoc excess/ purulent sputum or mucus plugs or hemoptysis or cp or chest tightness, subjective wheeze or overt sinus or hb symptoms.   Sleeping  without nocturnal  or early am exacerbation  of respiratory  c/o's or need for noct saba. Also denies any obvious fluctuation of symptoms with weather or environmental changes or other aggravating or alleviating factors except as outlined above   No unusual exposure hx or h/o childhood pna/ asthma or knowledge of premature birth.  Current Allergies, Complete Past Medical History, Past Surgical History, Family History, and Social History were reviewed in [employer] record.  ROS  The following are not active complaints unless bolded Hoarseness, sore throat, dysphagia, dental problems, itching, sneezing,  nasal congestion or discharge of excess mucus or purulent secretions, ear ache,   fever, chills, sweats, unintended wt loss or wt gain, classically pleuritic or exertional cp,  orthopnea pnd or arm/hand swelling  or leg swelling, presyncope, palpitations, abdominal pain, anorexia, nausea, vomiting, diarrhea  or change in bowel habits or change in bladder habits, change in stools or change in urine, dysuria, hematuria,  rash, arthralgias, visual complaints, headache, numbness, weakness or ataxia or problems with walking or coordination,  change in mood or  memory.        Current Meds  Medication Sig   albuterol (PROVENTIL) (2.5 MG/3ML) 0.083% nebulizer solution  Take 3 mLs (2.5 mg total) by nebulization every 4 (four) hours as needed for wheezing or shortness of breath.   albuterol (VENTOLIN HFA) 108 (90 Base) MCG/ACT inhaler INHALE 2 PUFFS INTO THE LUNGS EVERY 6 HOURS AS NEEDED FOR WHEEZING OR SHORTNESS OF BREATH   amLODipine (NORVASC) 5 MG tablet TAKE 1 TABLET(5 MG) BY MOUTH DAILY   budesonide-formoterol (SYMBICORT) 160-4.5 MCG/ACT inhaler INHALE 2 PUFFS INTO THE LUNGS FIRST THING IN THE MORNING THEN 2 PUFFS ABOUT 12 HOURS LATER   levocetirizine (XYZAL) 5 MG tablet Take 5 mg by mouth daily as needed.    Multiple Vitamin (MULTIVITAMIN) tablet Take 1 tablet by mouth every evening.                    Objective:   Physical Exam    11/07/2020       112  09/20/2018         106  04/12/2018       111 03/25/2018         113 09/22/2017         113 03/25/2017         113  09/22/2016         122  03/24/2016         120  12/04/2015      111 10/23/2015         109  08/24/2015          109  07/13/2015        109  05/02/15 108 lb (48.988 kg)  11/09/07 112 lb 4.8 oz (50.939 kg)  10/12/07 106 lb 14.4 oz (48.49 kg)     Vital signs reviewed  11/07/2020  - Note at rest 02 sats  98% on RA   General appearance:    somber amb wm nad   HEENT : pt wearing mask not removed for exam due to covid - 19 concerns.    NECK :  without JVD/Nodes/TM/ nl carotid upstrokes bilaterally   LUNGS: no acc muscle use,  Mild barrel  contour chest wall with bilateral minimal insp /exp rhonchi  and  without cough on insp or exp maneuvers  and mild  Hyperresonant  to  percussion bilaterally     CV:  RRR  no s3 or murmur or increase in P2, and 1+ slt more on L vs R LE  pitting  edema   ABD:  soft and nontender with pos end  insp Hoover's  in the supine position. No bruits or organomegaly appreciated, bowel sounds nl  MS:   Nl gait/  ext warm without deformities, calf tenderness, cyanosis or clubbing No obvious joint restrictions   SKIN: warm and dry without lesions    NEURO:  alert,  approp, nl sensorium with  no motor or cerebellar deficits apparent.         Labs ordered 11/07/2020  :  allergy profile     Labs ordered/ reviewed:      Chemistry      Component Value Date/Time   NA 138 01/10/2016 1820   K 3.9 01/10/2016 1820   CL 102 01/10/2016 1820   CO2 21 (L) 01/10/2016 1820   BUN 9 01/10/2016 1820   CREATININE 1.05 01/10/2016 1820      Component Value Date/Time   CALCIUM 8.7 (L) 01/10/2016 1820   ALKPHOS 63 10/12/2007 1529   AST 27 10/12/2007 1529   ALT 34 10/12/2007 1529   BILITOT 0.7 10/12/2007 1529        Lab Results  Component Value Date   WBC 11.1 (H) 01/10/2016   HGB 16.8 01/10/2016   HCT 49.0 01/10/2016   MCV 99.6 01/10/2016   PLT 241 01/10/2016     No results found for: DDIMER    No results found for: TSH   No results found for: PROBNP    No results found for: ESRSEDRATE        Assessment & Plan:

## 2020-11-07 NOTE — Assessment & Plan Note (Addendum)
D/c norvasc 11/07/2020 due to LE pitting edema   >>>  Try micardis 40 mg daily and avoid salt   >>>  Check labs and refer to PCP          Each maintenance medication was reviewed in detail including emphasizing most importantly the difference between maintenance and prns and under what circumstances the prns are to be triggered using an action plan format where appropriate.  Total time for H and P, chart review, counseling, reviewing hfa device(s) and generating customized AVS unique to this office visit / same day charting > 30 min

## 2020-11-07 NOTE — Assessment & Plan Note (Addendum)
Quit smoking 2016   Spirometry 08/11/13   FEV1  1.67 / FVC 3.65 ratio 46%  05/02/2015  extensive coaching HFA effectiveness =    75% > try symbicort 160 2bid  - alpha one screening 05/02/2015 >  MM, levels ok - PFT's  07/13/2015  FEV1 1.97 (56 % ) ratio 55  p 21 % improvement from saba p symb 160 x2 in am prior to study with DLCO  64/61 % corrects to 72 % for alv volume  - 07/13/2015  add trial of spiriva respimat > no improvement - 10/23/2015 pt preferred symbicort > bevespi so rec resume symbicort 160 2bid - 12/04/2015 referred to  rehab > 03/24/2016 referred again > declined due to cost - 03/24/2019  After extensive coaching inhaler device,  effectiveness =    75% > try breztri  2bid >  11/23/2019 changed back to symb 160 pt preference   Group D in terms of symptom/risk and laba/lama/ICS  therefore appropriate rx at this point >>>  Could not tol breztri, doing well on symb 160 2id   Annual flu due/ also will need over all vaccination record updated/ reviewed > PCP referral

## 2020-11-12 LAB — CBC WITH DIFFERENTIAL/PLATELET
Basophils Absolute: 0 10*3/uL (ref 0.0–0.2)
Basos: 1 %
EOS (ABSOLUTE): 0.1 10*3/uL (ref 0.0–0.4)
Eos: 1 %
Hematocrit: 42.9 % (ref 37.5–51.0)
Hemoglobin: 14.7 g/dL (ref 13.0–17.7)
Immature Grans (Abs): 0 10*3/uL (ref 0.0–0.1)
Immature Granulocytes: 0 %
Lymphocytes Absolute: 2.7 10*3/uL (ref 0.7–3.1)
Lymphs: 41 %
MCH: 39.4 pg — ABNORMAL HIGH (ref 26.6–33.0)
MCHC: 34.3 g/dL (ref 31.5–35.7)
MCV: 115 fL — ABNORMAL HIGH (ref 79–97)
Monocytes Absolute: 0.5 10*3/uL (ref 0.1–0.9)
Monocytes: 7 %
Neutrophils Absolute: 3.3 10*3/uL (ref 1.4–7.0)
Neutrophils: 50 %
Platelets: 201 10*3/uL (ref 150–450)
RBC: 3.73 x10E6/uL — ABNORMAL LOW (ref 4.14–5.80)
RDW: 13.2 % (ref 11.6–15.4)
WBC: 6.6 10*3/uL (ref 3.4–10.8)

## 2020-11-12 LAB — BASIC METABOLIC PANEL
BUN/Creatinine Ratio: 4 — ABNORMAL LOW (ref 9–20)
BUN: 3 mg/dL — ABNORMAL LOW (ref 6–24)
CO2: 26 mmol/L (ref 20–29)
Calcium: 8.9 mg/dL (ref 8.7–10.2)
Chloride: 98 mmol/L (ref 96–106)
Creatinine, Ser: 0.78 mg/dL (ref 0.76–1.27)
Glucose: 111 mg/dL — ABNORMAL HIGH (ref 65–99)
Potassium: 3.2 mmol/L — ABNORMAL LOW (ref 3.5–5.2)
Sodium: 141 mmol/L (ref 134–144)
eGFR: 111 mL/min/{1.73_m2} (ref >59)

## 2020-11-12 LAB — HEPATIC FUNCTION PANEL
ALT: 52 IU/L — ABNORMAL HIGH (ref 0–44)
AST: 110 IU/L — ABNORMAL HIGH (ref 0–40)
Albumin: 3.6 g/dL — ABNORMAL LOW (ref 4.0–5.0)
Alkaline Phosphatase: 131 IU/L — ABNORMAL HIGH (ref 44–121)
Bilirubin Total: 1.3 mg/dL — ABNORMAL HIGH (ref 0.0–1.2)
Bilirubin, Direct: 0.63 mg/dL — ABNORMAL HIGH (ref 0.00–0.40)
Total Protein: 6 g/dL (ref 6.0–8.5)

## 2020-11-12 LAB — TSH: TSH: 1.75 u[IU]/mL (ref 0.450–4.500)

## 2020-11-12 LAB — IGE: IgE (Immunoglobulin E), Serum: 3350 IU/mL — ABNORMAL HIGH (ref 6–495)

## 2020-11-12 LAB — D-DIMER, QUANTITATIVE: D-DIMER: 0.66 mg/L FEU — ABNORMAL HIGH (ref 0.00–0.49)

## 2020-11-12 LAB — BRAIN NATRIURETIC PEPTIDE: BNP: 16.6 pg/mL (ref 0.0–100.0)

## 2020-11-13 ENCOUNTER — Encounter: Payer: Self-pay | Admitting: Internal Medicine

## 2020-11-13 DIAGNOSIS — R7989 Other specified abnormal findings of blood chemistry: Secondary | ICD-10-CM | POA: Insufficient documentation

## 2020-11-15 ENCOUNTER — Other Ambulatory Visit: Payer: Self-pay

## 2020-11-15 DIAGNOSIS — R7989 Other specified abnormal findings of blood chemistry: Secondary | ICD-10-CM

## 2020-11-15 DIAGNOSIS — T7840XA Allergy, unspecified, initial encounter: Secondary | ICD-10-CM

## 2020-11-20 ENCOUNTER — Encounter: Payer: Self-pay | Admitting: Internal Medicine

## 2020-11-29 ENCOUNTER — Other Ambulatory Visit: Payer: Self-pay | Admitting: Internal Medicine

## 2020-12-25 ENCOUNTER — Telehealth: Payer: Self-pay | Admitting: Internal Medicine

## 2020-12-25 NOTE — Telephone Encounter (Signed)
FYI: Pt does not qualify for lung cancer screening due to current age of 68. Pt must be age 46 - 66 to qualify. Left message for pt letting him know referral has been cancelled.

## 2020-12-29 ENCOUNTER — Other Ambulatory Visit: Payer: Self-pay | Admitting: Internal Medicine

## 2021-01-03 ENCOUNTER — Ambulatory Visit: Payer: Federal, State, Local not specified - PPO | Admitting: Nurse Practitioner

## 2021-01-03 ENCOUNTER — Encounter: Payer: Self-pay | Admitting: Nurse Practitioner

## 2021-01-03 ENCOUNTER — Other Ambulatory Visit: Payer: Self-pay

## 2021-01-03 VITALS — BP 124/83 | HR 102 | Ht 66.0 in | Wt 103.0 lb

## 2021-01-03 DIAGNOSIS — K529 Noninfective gastroenteritis and colitis, unspecified: Secondary | ICD-10-CM

## 2021-01-03 DIAGNOSIS — J449 Chronic obstructive pulmonary disease, unspecified: Secondary | ICD-10-CM

## 2021-01-03 DIAGNOSIS — F29 Unspecified psychosis not due to a substance or known physiological condition: Secondary | ICD-10-CM | POA: Diagnosis not present

## 2021-01-03 DIAGNOSIS — E539 Vitamin B deficiency, unspecified: Secondary | ICD-10-CM | POA: Diagnosis not present

## 2021-01-03 DIAGNOSIS — G8929 Other chronic pain: Secondary | ICD-10-CM | POA: Insufficient documentation

## 2021-01-03 DIAGNOSIS — R630 Anorexia: Secondary | ICD-10-CM | POA: Diagnosis not present

## 2021-01-03 DIAGNOSIS — Z139 Encounter for screening, unspecified: Secondary | ICD-10-CM

## 2021-01-03 DIAGNOSIS — R7989 Other specified abnormal findings of blood chemistry: Secondary | ICD-10-CM

## 2021-01-03 DIAGNOSIS — R1084 Generalized abdominal pain: Secondary | ICD-10-CM

## 2021-01-03 MED ORDER — ENSURE ENLIVE PO LIQD: 237.0000 mL | Freq: Two times a day (BID) | ORAL | Status: AC

## 2021-01-03 NOTE — Progress Notes (Signed)
New Patient Office Visit  Subjective:  Patient ID: Ethan Norton, male    DOB: Jan 24, 1976  Age: 45 y.o. MRN: [id number]  CC:  Chief Complaint  Patient presents with   New Patient (Initial Visit)    New pt. Previous PCP Kip Corrington - last seen about 5 years ago. Has had some intestinal problems. Eats 3-4 bites and then feels full. Will have diarrhea and vomiting off and on x 1.5-2 weeks. Has also had weight loss.     HPI Ethan Norton presents to establish care.  Pt c/o chronic abdominal pain that is ongoing for years. His abdominal is sharp about 9/10, it occurs just before he has diarrhea. The pain starts on the left side and moves to the right side. He stated that he has low appetite has been feeling full quickly when eating, has  intermittent diarrhea with abdominal pain.Marland Kitchen His diarrhea has gotten worse the past two weeks , he sometimes have 20 diarrhea a day , his stool color is sometimes dark with mucus. He took immodium yesterday and it helped stopped his diarrhea. He has been having nausea and vomiting as well. He denies fever, chills headache, chest pain, has lost about 10 lb in a month. He has chronic generalized aching joint pain. He has cough and wheezing due to COPD.   He has not been to his PCP in years.  COPD.Goes to [employer] pulmonologist , takes albuterol inhaler and Symbicort. Pt is a former smoker , smoked 1.5 pack per day for 21 years, quit smoking in 2016. Grandfather had lung cancer. Request tor lung CT made by pulmonologist  was denied due to patients age.   Elevated LFT noted in the labs done this past September. He stated that the has an appointment with GI on Feb 15,2023. Pt stated that he drinks about 2 glasses of wine per week. He stated that his mother has ovarian cancer that has metastases to her colon and she is currently on chemotherapy. He has never had colonoscopy.  Has family history of huntington disease. His dad died from the disease and his  brother suffers from the disease. He stated that he was tested for the disease years ago and that the test was negative.   He has had 2 doses of COVID vaccine(MODERNA). He does not want the booster because he was sick when he had the first two doses of COVID vaccine. He stated that he has had his flu shot     Past Medical History:  Diagnosis Date   Anxiety    COPD (chronic obstructive pulmonary disease) (HCC)    Depression    Hypertension    Neuropathic pain     Past Surgical History:  Procedure Laterality Date   WISDOM TOOTH EXTRACTION      Family History  Problem Relation Age of Onset   Ovarian cancer Mother 71   Stomach cancer Mother 59   Diabetes Mother    Hypertension Mother    Huntington's disease Father        died at 65   Huntington's disease Brother    Colon cancer Maternal Grandmother    Heart attack Maternal Grandfather    Emphysema Paternal Grandmother    Rheum arthritis Paternal Grandmother    Lung cancer Paternal Grandmother    Heart attack Paternal Grandfather     Social History   Socioeconomic History   Marital status: Married    Spouse name: Not on file   Number of children:  3   Years of education: Not on file   Highest education level: Some college, no degree  Occupational History   Not on file  Tobacco Use   Smoking status: Former    Packs/day: 1.50    Years: 21.00    Pack years: 31.50    Types: Cigarettes    Quit date: 02/17/2014    Years since quitting: 6.8   Smokeless tobacco: Never  Vaping Use   Vaping Use: Never used  Substance and Sexual Activity   Alcohol use: Yes    Alcohol/week: 2.0 standard drinks    Types: 2 Glasses of wine per week    Comment: occassional 2 glasses of wine a week   Drug use: No   Sexual activity: Yes    Comment: has one sexual patner  Other Topics Concern   Not on file  Social History Narrative   Not on file   Social Determinants of Health   Financial Resource Strain: Not on file  Food  Insecurity: Not on file  Transportation Needs: Not on file  Physical Activity: Not on file  Stress: Not on file  Social Connections: Not on file  Intimate Partner Violence: Not on file    ROS Review of Systems  Constitutional:  Positive for appetite change and unexpected weight change. Negative for activity change, chills, diaphoresis, fatigue and fever.  HENT:  Negative for ear discharge, ear pain, facial swelling and sore throat.        Chronic cough, wheezing.  Eyes:  Negative for pain, discharge and itching.  Respiratory:  Positive for cough, shortness of breath and wheezing. Negative for choking and chest tightness.   Cardiovascular:  Negative for chest pain, palpitations and leg swelling.  Gastrointestinal:  Positive for abdominal pain, diarrhea, nausea and vomiting. Negative for blood in stool and constipation.  Endocrine: Negative for cold intolerance and heat intolerance.  Genitourinary:  Negative for difficulty urinating, dysuria, enuresis and flank pain.  Skin:  Negative for color change, pallor, rash and wound.  Allergic/Immunologic:       Referral to allergist  pending   Neurological:  Negative for dizziness, seizures, syncope, facial asymmetry, speech difficulty, light-headedness, numbness and headaches.  Hematological:  Negative for adenopathy. Does not bruise/bleed easily.  Psychiatric/Behavioral:  Negative for agitation, behavioral problems, confusion, decreased concentration and hallucinations.    Objective:   Today's Vitals: BP 124/83 (BP Location: Right Arm, Patient Position: Sitting, Cuff Size: Normal)   Pulse (!) 102   Ht 5' 6"  (1.676 m)   Wt 103 lb (46.7 kg)   SpO2 94%   BMI 16.62 kg/m   Physical Exam Vitals and nursing note reviewed.  Constitutional:      General: He is not in acute distress.    Appearance: He is ill-appearing. He is not diaphoretic.  HENT:     Head: Normocephalic and atraumatic.     Right Ear: External ear normal.     Left Ear:  External ear normal.     Nose: Nose normal. No congestion or rhinorrhea.     Mouth/Throat:     Mouth: Mucous membranes are moist.     Pharynx: Oropharynx is clear.  Eyes:     Extraocular Movements: Extraocular movements intact.     Comments: Bilateral Yellowing of conjunctiva.   Pulmonary:     Effort: Pulmonary effort is normal. No respiratory distress.     Breath sounds: No stridor. Rhonchi present. No wheezing or rales.  Chest:     Chest wall:  No tenderness.  Abdominal:     General: Abdomen is flat. There is no distension.     Palpations: Abdomen is soft. There is no mass.     Tenderness: There is abdominal tenderness. There is guarding.     Hernia: No hernia is present.  Musculoskeletal:        General: No swelling, tenderness, deformity or signs of injury.     Cervical back: Normal range of motion. No rigidity.     Right lower leg: No edema.     Left lower leg: No edema.  Skin:    General: Skin is warm and dry.     Capillary Refill: Capillary refill takes less than 2 seconds.     Findings: No bruising.     Comments: Scattered bruises.   Neurological:     General: No focal deficit present.     Mental Status: He is alert and oriented to person, place, and time.  Psychiatric:        Mood and Affect: Mood normal.        Thought Content: Thought content normal.        Judgment: Judgment normal.    Assessment & Plan:   Problem List Items Addressed This Visit       Other   Elevated LFTs - Primary   Relevant Orders   CMP14+EGFR   Hepatic function panel   CBC   Other Visit Diagnoses     Screening due       Relevant Orders   HIV antibody (with reflex)   Hepatitis C Antibody   Ambulatory referral to Gastroenterology   Generalized abdominal pain       Relevant Orders   US Abdomen Complete   Loss of appetite       Relevant Medications   feeding supplement (ENSURE ENLIVE / ENSURE PLUS) liquid 237 mL       Outpatient Encounter Medications as of 01/03/2021   Medication Sig   albuterol (PROVENTIL) (2.5 MG/3ML) 0.083% nebulizer solution Take 3 mLs (2.5 mg total) by nebulization every 4 (four) hours as needed for wheezing or shortness of breath.   albuterol (VENTOLIN HFA) 108 (90 Base) MCG/ACT inhaler INHALE 2 PUFFS INTO THE LUNGS EVERY 6 HOURS AS NEEDED FOR WHEEZING OR SHORTNESS OF BREATH   budesonide-formoterol (SYMBICORT) 160-4.5 MCG/ACT inhaler INHALE 2 PUFFS INTO THE LUNGS TWICE DAILY   famotidine (PEPCID) 20 MG tablet Take 20 mg by mouth 2 (two) times daily.   Loperamide HCl (IMODIUM PO) Take by mouth.   Multiple Vitamin (MULTIVITAMIN) tablet Take 1 tablet by mouth every evening.    omeprazole (PRILOSEC) 20 MG capsule Take 20 mg by mouth daily.   telmisartan (MICARDIS) 40 MG tablet Take 1 tablet (40 mg total) by mouth daily.   levocetirizine (XYZAL) 5 MG tablet Take 5 mg by mouth daily as needed.  (Patient not taking: Reported on 01/03/2021)   Facility-Administered Encounter Medications as of 01/03/2021  Medication   feeding supplement (ENSURE ENLIVE / ENSURE PLUS) liquid 237 mL    Follow-up: Return in about 4 weeks (around 01/31/2021).   Renee Rival, FNP

## 2021-01-03 NOTE — Assessment & Plan Note (Deleted)
chronic abdominal pain ongoing for years.  Loss of appetite, gest full easily when eating.  LFT elevated last blood work. Blood work redone today.  GI referral made.

## 2021-01-03 NOTE — Assessment & Plan Note (Signed)
chronic abdominal pain ongoing for years.  Loss of appetite, gest full easily when eating.  LFT elevated last blood work. Blood work redone today.  GI referral made.

## 2021-01-03 NOTE — Addendum Note (Signed)
Addended by: Renee Rival on: 01/03/2021 09:05 PM   Modules accepted: Level of Service

## 2021-01-03 NOTE — Assessment & Plan Note (Signed)
Labs recked today. GI referral made. Pt advised to stop drinking alcohol and to avoid using acetaminophen.

## 2021-01-03 NOTE — Patient Instructions (Signed)
Please get your lab work done Please Do not use tylenol and do not drink  Get your abdominal ultrasound done. Please return the stool sample card when you have three stool samples on themPl  You have been referred for colonoscopy.     Will will get back to you about your lab result.

## 2021-01-03 NOTE — Assessment & Plan Note (Signed)
Managed by pulmonary.  Continue on current medications.

## 2021-01-03 NOTE — Assessment & Plan Note (Signed)
Ensure supplement ordered.

## 2021-01-03 NOTE — Assessment & Plan Note (Addendum)
C/O of chronic intermittent diarrhea. Takes imodium PRN. Hemoccult cards given to patient with instructions on how to use card. Abdominal US ordered.  GI referral made  Referral made to allergist and immunologist by pulmonologist.

## 2021-01-04 ENCOUNTER — Other Ambulatory Visit: Payer: Self-pay | Admitting: Nurse Practitioner

## 2021-01-04 DIAGNOSIS — E876 Hypokalemia: Secondary | ICD-10-CM

## 2021-01-04 DIAGNOSIS — E539 Vitamin B deficiency, unspecified: Secondary | ICD-10-CM

## 2021-01-04 LAB — CMP14+EGFR
ALT: 52 IU/L — ABNORMAL HIGH (ref 0–44)
AST: 112 IU/L — ABNORMAL HIGH (ref 0–40)
Albumin/Globulin Ratio: 1.3 (ref 1.2–2.2)
Albumin: 3.5 g/dL — ABNORMAL LOW (ref 4.0–5.0)
Alkaline Phosphatase: 125 IU/L — ABNORMAL HIGH (ref 44–121)
BUN/Creatinine Ratio: 7 — ABNORMAL LOW (ref 9–20)
BUN: 7 mg/dL (ref 6–24)
Bilirubin Total: 1.2 mg/dL (ref 0.0–1.2)
CO2: 24 mmol/L (ref 20–29)
Calcium: 8.6 mg/dL — ABNORMAL LOW (ref 8.7–10.2)
Chloride: 96 mmol/L (ref 96–106)
Creatinine, Ser: 0.94 mg/dL (ref 0.76–1.27)
Globulin, Total: 2.7 g/dL (ref 1.5–4.5)
Glucose: 60 mg/dL — ABNORMAL LOW (ref 70–99)
Potassium: 3.2 mmol/L — ABNORMAL LOW (ref 3.5–5.2)
Sodium: 142 mmol/L (ref 134–144)
Total Protein: 6.2 g/dL (ref 6.0–8.5)
eGFR: 101 mL/min/{1.73_m2} (ref >59)

## 2021-01-04 LAB — HEPATIC FUNCTION PANEL
ALT: 54 IU/L — ABNORMAL HIGH (ref 0–44)
AST: 116 IU/L — ABNORMAL HIGH (ref 0–40)
Albumin: 3.6 g/dL — ABNORMAL LOW (ref 4.0–5.0)
Alkaline Phosphatase: 128 IU/L — ABNORMAL HIGH (ref 44–121)
Bilirubin Total: 1.2 mg/dL (ref 0.0–1.2)
Bilirubin, Direct: 0.78 mg/dL — ABNORMAL HIGH (ref 0.00–0.40)
Total Protein: 6.1 g/dL (ref 6.0–8.5)

## 2021-01-04 LAB — CBC
Hematocrit: 38.8 % (ref 37.5–51.0)
Hemoglobin: 13.7 g/dL (ref 13.0–17.7)
MCH: 40.7 pg — ABNORMAL HIGH (ref 26.6–33.0)
MCHC: 35.3 g/dL (ref 31.5–35.7)
MCV: 115 fL — ABNORMAL HIGH (ref 79–97)
Platelets: 186 10*3/uL (ref 150–450)
RBC: 3.37 x10E6/uL — ABNORMAL LOW (ref 4.14–5.80)
RDW: 13.3 % (ref 11.6–15.4)
WBC: 7.2 10*3/uL (ref 3.4–10.8)

## 2021-01-04 LAB — HIV ANTIBODY (ROUTINE TESTING W REFLEX): HIV Screen 4th Generation wRfx: NONREACTIVE

## 2021-01-04 LAB — HEPATITIS C ANTIBODY: Hep C Virus Ab: <0.1 s/co ratio (ref 0.0–0.9)

## 2021-01-04 MED ORDER — POTASSIUM CHLORIDE CRYS ER 20 MEQ PO TBCR
20.0000 meq | EXTENDED_RELEASE_TABLET | Freq: Every day | ORAL | 2 refills | Status: DC
Start: 2021-01-04 — End: 2021-02-01

## 2021-01-07 LAB — B12 AND FOLATE PANEL
Folate: 5.1 ng/mL (ref >3.0)
Vitamin B-12: 922 pg/mL (ref 232–1245)

## 2021-01-07 LAB — SPECIMEN STATUS REPORT

## 2021-01-09 ENCOUNTER — Ambulatory Visit (HOSPITAL_COMMUNITY)
Admission: RE | Admit: 2021-01-09 | Discharge: 2021-01-09 | Disposition: A | Discharge status: Home / Self Care | Payer: Federal, State, Local not specified - PPO | Source: Ambulatory Visit | Attending: Nurse Practitioner | Admitting: Nurse Practitioner

## 2021-01-09 ENCOUNTER — Other Ambulatory Visit: Payer: Self-pay

## 2021-01-09 DIAGNOSIS — K760 Fatty (change of) liver, not elsewhere classified: Secondary | ICD-10-CM | POA: Diagnosis not present

## 2021-01-09 DIAGNOSIS — R1084 Generalized abdominal pain: Secondary | ICD-10-CM | POA: Insufficient documentation

## 2021-01-11 ENCOUNTER — Other Ambulatory Visit (INDEPENDENT_AMBULATORY_CARE_PROVIDER_SITE_OTHER): Payer: Federal, State, Local not specified - PPO

## 2021-01-11 DIAGNOSIS — Z1211 Encounter for screening for malignant neoplasm of colon: Secondary | ICD-10-CM | POA: Diagnosis not present

## 2021-01-11 LAB — HEMOCCULT GUIAC POC 1CARD (OFFICE)
Card #2 Fecal Occult Blod, POC: NEGATIVE
Card #3 Fecal Occult Blood, POC: NEGATIVE
Fecal Occult Blood, POC: NEGATIVE

## 2021-01-25 ENCOUNTER — Ambulatory Visit: Payer: Self-pay | Admitting: Allergy & Immunology

## 2021-01-31 ENCOUNTER — Other Ambulatory Visit: Payer: Self-pay

## 2021-01-31 ENCOUNTER — Encounter: Payer: Self-pay | Admitting: Nurse Practitioner

## 2021-01-31 ENCOUNTER — Ambulatory Visit: Payer: Federal, State, Local not specified - PPO | Admitting: Nurse Practitioner

## 2021-01-31 VITALS — BP 148/86 | HR 98 | Ht 66.0 in | Wt 106.0 lb

## 2021-01-31 DIAGNOSIS — I10 Essential (primary) hypertension: Secondary | ICD-10-CM | POA: Diagnosis not present

## 2021-01-31 DIAGNOSIS — R7989 Other specified abnormal findings of blood chemistry: Secondary | ICD-10-CM | POA: Diagnosis not present

## 2021-01-31 DIAGNOSIS — E876 Hypokalemia: Secondary | ICD-10-CM | POA: Diagnosis not present

## 2021-01-31 DIAGNOSIS — K529 Noninfective gastroenteritis and colitis, unspecified: Secondary | ICD-10-CM

## 2021-01-31 NOTE — Assessment & Plan Note (Signed)
Recheck labs today. He has stopped taking potassium.

## 2021-01-31 NOTE — Assessment & Plan Note (Signed)
DASH diet and commitment to daily physical activity for a minimum of 30 minutes discussed and encouraged, as a part of hypertension management. The importance of attaining a healthy weight is also discussed.  BP/Weight 01/31/2021 01/03/2021 11/07/2020 03/24/2019 09/20/2018 4/37/3578 10/24/8476  Systolic BP 412 820 813 887 195 974 98  Diastolic BP 86 83 72 80 84 80 60  Wt. (Lbs) 106 103 112.12 110 106.4 111.6 113.4  BMI 17.11 16.62 18.1 18.3 17.71 18.57 18.87

## 2021-01-31 NOTE — Patient Instructions (Addendum)
Please get your lab work done today.  Avoid salty food  It is important that you exercise regularly at least 30 minutes 5 times a week.  Think about what you will eat, plan ahead. Choose " clean, green, fresh or frozen" over canned, processed or packaged foods which are more sugary, salty and fatty. 70 to 75% of food eaten should be vegetables and fruit. Three meals at set times with snacks allowed between meals, but they must be fruit or vegetables. Aim to eat over a 12 hour period , example 7 am to 7 pm, and STOP after  your last meal of the day. Drink water,generally about 64 ounces per day, no other drink is as healthy. Fruit juice is best enjoyed in a healthy way, by EATING the fruit.  Thanks for choosing [facility], we consider it a privelige to serve you.

## 2021-01-31 NOTE — Assessment & Plan Note (Signed)
diarrhea now resolved, has a GI appointment on 04/03/2021 Take imodium as needed.

## 2021-01-31 NOTE — Progress Notes (Signed)
° °  EWARD RUTIGLIANO     MRN: [id number]      DOB: November 14, 1975   HPI Mr. Tuman is here for follow up and re-evaluation of chronic medical conditions, medication management and review of any available recent lab and radiology data.  Preventive health is updated, specifically  Cancer screening and Immunization.   Questions or concerns regarding consultations or procedures which the PT has had in the interim are  addressed. The PT denies any adverse reactions to current medications since the last visit.      Previous c/o of N/V/D now resolved, he has been eating better. He has not been taking potasium supplement due to the size. He only took  3 doses and he would like to have the liquid form of potassium. He has taking nutritional supplement from [facility] because ensure is expensive.  ROS Denies recent fever or chills. Denies sinus pressure, nasal congestion, ear pain or sore throat. Denies chest congestion, productive cough or wheezing. Denies chest pains, palpitations and leg swelling Denies abdominal pain, nausea, vomiting,diarrhea or constipation.   Denies dysuria, frequency, hesitancy or incontinence. Has  joint pain, no swelling and limitation in mobility. Denies headaches, seizures, numbness, or tingling. Denies depression, anxiety or insomnia. Denies skin break down or rash.   PE  BP (!) 148/86 (BP Location: Right Arm, Patient Position: Sitting, Cuff Size: Normal)    Pulse 98    Ht 5' 6"  (1.676 m)    Wt 106 lb (48.1 kg)    SpO2 94%    BMI 17.11 kg/m   Patient alert and oriented and in no cardiopulmonary distress.  HEENT: No facial asymmetry, EOMI,     Neck supple .  Chest: Clear to auscultation bilaterally.  CVS: S1, S2 no murmurs, no S3.Regular rate.  ABD: Soft non tender.   Ext: No edema  MS: Adequate ROM spine, shoulders, hips and knees.  Skin: Intact, no ulcerations or rash noted.  Psych: Good eye contact, normal affect. Memory intact not anxious or  depressed appearing.  CNS: CN 2-12 intact, power,  normal throughout.no focal deficits noted.   Assessment & Plan

## 2021-02-01 ENCOUNTER — Other Ambulatory Visit: Payer: Self-pay | Admitting: Nurse Practitioner

## 2021-02-01 ENCOUNTER — Telehealth: Payer: Self-pay | Admitting: Nurse Practitioner

## 2021-02-01 DIAGNOSIS — E876 Hypokalemia: Secondary | ICD-10-CM

## 2021-02-01 LAB — CMP14+EGFR
ALT: 39 IU/L (ref 0–44)
AST: 106 IU/L — ABNORMAL HIGH (ref 0–40)
Albumin/Globulin Ratio: 1.4 (ref 1.2–2.2)
Albumin: 3.4 g/dL — ABNORMAL LOW (ref 4.0–5.0)
Alkaline Phosphatase: 110 IU/L (ref 44–121)
BUN/Creatinine Ratio: 13 (ref 9–20)
BUN: 9 mg/dL (ref 6–24)
Bilirubin Total: 1.3 mg/dL — ABNORMAL HIGH (ref 0.0–1.2)
CO2: 27 mmol/L (ref 20–29)
Calcium: 7.8 mg/dL — ABNORMAL LOW (ref 8.7–10.2)
Chloride: 97 mmol/L (ref 96–106)
Creatinine, Ser: 0.68 mg/dL — ABNORMAL LOW (ref 0.76–1.27)
Globulin, Total: 2.4 g/dL (ref 1.5–4.5)
Glucose: 87 mg/dL (ref 70–99)
Potassium: 2.9 mmol/L — ABNORMAL LOW (ref 3.5–5.2)
Sodium: 140 mmol/L (ref 134–144)
Total Protein: 5.8 g/dL — ABNORMAL LOW (ref 6.0–8.5)
eGFR: 116 mL/min/{1.73_m2} (ref >59)

## 2021-02-01 MED ORDER — POTASSIUM CHLORIDE 20 MEQ/15ML (10%) PO SOLN: 20.0000 meq | Freq: Every day | ORAL | 0 refills | Status: DC

## 2021-02-01 MED ORDER — CALCIUM CARBONATE 1500 (600 CA) MG PO TABS: 1500.0000 mg | ORAL_TABLET | Freq: Two times a day (BID) | ORAL | 2 refills | Status: DC

## 2021-02-01 NOTE — Telephone Encounter (Signed)
Pt returning phone call ° °

## 2021-02-03 LAB — SPECIMEN STATUS REPORT

## 2021-02-03 LAB — MAGNESIUM: Magnesium: 1.3 mg/dL — ABNORMAL LOW (ref 1.6–2.3)

## 2021-02-04 ENCOUNTER — Telehealth: Payer: Self-pay | Admitting: *Deleted

## 2021-02-04 ENCOUNTER — Other Ambulatory Visit: Payer: Self-pay | Admitting: Nurse Practitioner

## 2021-02-04 MED ORDER — MAGNESIUM OXIDE 400 240 MG PO PACK: 400.0000 mg | PACK | Freq: Every day | ORAL | 0 refills | Status: DC

## 2021-02-04 NOTE — Chronic Care Management (AMB) (Signed)
°  Care Management   Note  02/04/2021 Name: ARTICE BERGERSON MRN: [id number] DOB: 1975-07-27  DENILSON SALMINEN is a 45 y.o. year old male who is a primary care patient of Renee Rival, FNP. I reached out to Noel Gerold by phone today in response to a referral sent by Mr. Loletha Grayer Micciche's primary care provider.   Mr. Harn was given information about care management services today including:  Care management services include personalized support from designated clinical staff supervised by his physician, including individualized plan of care and coordination with other care providers 24/7 contact phone numbers for assistance for urgent and routine care needs. The patient may stop care management services at any time by phone call to the office staff.  Patient agreed to services and verbal consent obtained.   Follow up plan: Telephone appointment with care management team member scheduled for:02/14/21  [place] Management  Direct Dial: [phone]

## 2021-02-04 NOTE — Telephone Encounter (Signed)
Kristin Bruins spoke with patients spouse regarding labs last week

## 2021-02-04 NOTE — Telephone Encounter (Signed)
° °  Telephone encounter was:  Successful.  02/04/2021 Name: ASKARI KINLEY MRN: [id number] DOB: 14-Jun-1975  RICARD FAULKNER is a 45 y.o. year old male who is a primary care patient of Renee Rival, FNP . The community resource team was consulted for assistance with [place] guide performed the following interventions: Patient provided with information about care guide support team and interviewed to confirm resource needs.  Follow Up Plan:  No further follow up planned at this time. The patient has been provided with needed resources.Going to send coupons from [facility] for ensure   [place], Care Management  [phone] [address] [place] , [place] [address] Email : [email or web address]

## 2021-02-14 ENCOUNTER — Ambulatory Visit: Payer: Federal, State, Local not specified - PPO | Admitting: Pharmacist

## 2021-02-14 DIAGNOSIS — E876 Hypokalemia: Secondary | ICD-10-CM

## 2021-02-14 NOTE — Chronic Care Management (AMB) (Signed)
Care Management   Pharmacy Note  02/14/2021 Name: Ethan Norton MRN: [id number] DOB: 10/31/1975  Summary: Medication reconciliation was completed per PCP request. Patient has been able to get and take the potassium, magnesium, and Ensure in response to hypokalemia and hypomagnesemia from recent labs. Patient reports he will come for repeat lab work on 02/19/21. No further follow-up from care coordination team at this time.   Subjective: Ethan Norton is a 45 y.o. year old male who is a primary care patient of Ethan Rival, FNP. The Care Management team was consulted for assistance with care management and care coordination needs.    Engaged with patient by telephone for initial visit in response to provider referral for pharmacy case management and/or care coordination services.   The patient was given information about Care Management services today including:  Care Management services includes personalized support from designated clinical staff supervised by the patient's primary care provider, including individualized plan of care and coordination with other care providers. 24/7 contact phone numbers for assistance for urgent and routine care needs. The patient may stop case management services at any time by phone call to the office staff.  Patient agreed to services and consent obtained.  Assessment:  Review of patient status, including review of consultants reports, laboratory and other test data, was performed as part of comprehensive evaluation and provision of chronic care management services.   SDOH (Social Determinants of Health) assessments and interventions performed:    Objective:  Lab Results  Component Value Date   CREATININE 0.68 (L) 01/31/2021   CREATININE 0.94 01/03/2021   CREATININE 0.78 11/07/2020    No results found for: HGBA1C  No results found for: CHOL, TRIG, HDL, CHOLHDL, VLDL, LDLCALC, LDLDIRECT  Clinical ASCVD: No  The ASCVD Risk  score (Arnett DK, et al., 2019) failed to calculate for the following reasons:   Cannot find a previous HDL lab   Cannot find a previous total cholesterol lab    BP Readings from Last 3 Encounters:  01/31/21 (!) 148/86  01/03/21 124/83  11/07/20 118/72    Care Plan  Allergies  Allergen Reactions   Epinephrine     Uncontrollable shaking with epinephrine in gums for dental work    Medications Reviewed Today     Reviewed by Beryle Lathe, [facility] (Pharmacist) on 02/14/21 at 43  Med List Status: <None>   Medication Order Taking? Sig Documenting Provider Last Dose Status Informant  albuterol (PROVENTIL) (2.5 MG/3ML) 0.083% nebulizer solution [id number] Yes Take 3 mLs (2.5 mg total) by nebulization every 4 (four) hours as needed for wheezing or shortness of breath. Tanda Rockers, MD Taking Active            Med Note [name] Feb 14, 2021  2:18 PM) Hasn't needed in 2-3 years  albuterol (VENTOLIN HFA) 108 (90 Base) MCG/ACT inhaler [id number] Yes INHALE 2 PUFFS INTO THE LUNGS EVERY 6 HOURS AS NEEDED FOR WHEEZING OR SHORTNESS OF Hulan Amato, MD Taking Active            Med Note [name] Feb 14, 2021  2:19 PM) Uses twice weekly  budesonide-formoterol (SYMBICORT) 160-4.5 MCG/ACT inhaler [id number] Yes INHALE 2 PUFFS INTO THE LUNGS TWICE DAILY Tanda Rockers, MD Taking Active   calcium carbonate (OSCAL) 1500 (600 Ca) MG TABS tablet [id number] No Take 1 tablet (1,500 mg total) by mouth 2 (two) times daily with a meal.  Patient not taking: Reported on 02/14/2021   Ethan Rival, FNP Not Taking Active   famotidine (PEPCID) 20 MG tablet [id number] No Take 20 mg by mouth 2 (two) times daily.  Patient not taking: Reported on 02/14/2021   [provider] Not Taking Active   feeding supplement (ENSURE ENLIVE / ENSURE PLUS) liquid 237 mL [id number]   Vena Rua R, FNP  Active   Ibuprofen 200 MG CAPS [id number] Yes Take  200-400 mg by mouth every 8 (eight) hours as needed. [provider] Taking Active Self  levocetirizine (XYZAL) 5 MG tablet [id number] Yes Take 5 mg by mouth daily as needed. [provider] Taking Active Spouse/Significant Other  Loperamide HCl (IMODIUM PO) [id number] No Take by mouth.  Patient not taking: Reported on 02/14/2021   [provider] Not Taking Active   Magnesium Oxide (MAGNESIUM OXIDE 400) 240 MG PACK [id number] Yes Take 400 mg by mouth daily. Ethan Rival, FNP Taking Active   Multiple Vitamin (MULTIVITAMIN) tablet [id number] Yes Take 1 tablet by mouth every evening.  [provider] Taking Active Spouse/Significant Other  omeprazole (PRILOSEC) 20 MG capsule [id number] Yes Take 20 mg by mouth daily. [provider] Taking Active   potassium chloride 20 MEQ/15ML (10%) SOLN [id number] Yes TAKE 15 MLS BY MOUTH EVERY DAY. ON THE FIRST DAY TAKE 30 MLS THEN 15 MLS THEREAFTER. Ethan Rival, FNP Taking Active   telmisartan (MICARDIS) 40 MG tablet [id number] Yes Take 1 tablet (40 mg total) by mouth daily. Tanda Rockers, MD Taking Active             Patient Active Problem List   Diagnosis Date Noted   Hypokalemia 01/31/2021   Chronic diarrhea of unknown origin 01/03/2021   Generalized abdominal pain 01/03/2021   Loss of appetite 01/03/2021   Elevated LFTs 11/13/2020   Leg swelling 11/07/2020   Cigarette smoker 04/18/2018   COPD with acute exacerbation ([place]) 09/22/2017   Alcohol intoxication delirium without use disorder ([place]) 01/11/2016   Essential hypertension 12/04/2015   Chest pain, non-cardiac 07/13/2015   COPD GOLD II 05/02/2015   VITAMIN B12 DEFICIENCY 10/13/2007   ERYTHROCYTOSIS 10/12/2007   CONFUSION 10/12/2007    Conditions to be addressed/monitored:  medication reconciliation  Medication Assistance:  None required.  Patient affirms current coverage meets needs.  Follow Up:  Patient requests no follow-up at this  time.  Plan: No further follow up required  Kennon Holter, PharmD, [place], CPP Clinical Pharmacist Practitioner [facility] Primary [facility] [phone]

## 2021-02-14 NOTE — Patient Instructions (Signed)
Ethan Norton,  It was great to talk to you today!  Please call me with any questions or concerns.   [name] was given information about Care Management services by the embedded care coordination team including:  Care Management services include personalized support from designated clinical staff supervised by his physician, including individualized plan of care and coordination with other care providers 24/7 contact phone numbers for assistance for urgent and routine care needs. The patient may stop CCM services at any time (effective at the end of the month) by phone call to the office staff.  Patient agreed to services and verbal consent obtained.   Patient verbalizes understanding of instructions provided today and agrees to view in [place].   No further follow up required  Kennon Holter, PharmD, [place], CPP Clinical Pharmacist Practitioner [facility] Primary Care [phone]

## 2021-02-16 ENCOUNTER — Other Ambulatory Visit: Payer: Self-pay | Admitting: Nurse Practitioner

## 2021-02-16 ENCOUNTER — Encounter: Payer: Self-pay | Admitting: Nurse Practitioner

## 2021-02-16 DIAGNOSIS — Z139 Encounter for screening, unspecified: Secondary | ICD-10-CM

## 2021-02-16 DIAGNOSIS — D539 Nutritional anemia, unspecified: Secondary | ICD-10-CM

## 2021-02-19 DIAGNOSIS — Z139 Encounter for screening, unspecified: Secondary | ICD-10-CM | POA: Diagnosis not present

## 2021-02-20 LAB — MAGNESIUM: Magnesium: 1.7 mg/dL (ref 1.6–2.3)

## 2021-02-20 LAB — LIPID PANEL
Chol/HDL Ratio: 2.6 ratio (ref 0.0–5.0)
Cholesterol, Total: 120 mg/dL (ref 100–199)
HDL: 46 mg/dL (ref >39)
LDL Chol Calc (NIH): 50 mg/dL (ref 0–99)
Triglycerides: 135 mg/dL (ref 0–149)
VLDL Cholesterol Cal: 24 mg/dL (ref 5–40)

## 2021-02-20 LAB — VITAMIN D 25 HYDROXY (VIT D DEFICIENCY, FRACTURES): Vit D, 25-Hydroxy: 43.8 ng/mL (ref 30.0–100.0)

## 2021-03-08 NOTE — Progress Notes (Signed)
[facility] [address] [place] [address]   CLINIC:  Medical Oncology/Hematology  CONSULT NOTE  Patient Care Team: Renee Rival, FNP as PCP - General (Nurse Practitioner)  CHIEF COMPLAINTS/PURPOSE OF CONSULTATION:  Macrocytosis  HISTORY OF PRESENTING ILLNESS:  Ethan Norton 46 y.o. male is here at the request of his primary care provider (NP Vena Rua) for evaluation of macrocytosis.  He is not on any medications classically associated with macrocytosis.  He denies any known history of liver disease, nutritional deficiencies, or thyroid disease.  He is a former Secondary school teacher and denies any history of significant chemical exposures.  He does have a history of heavy alcohol consumption.  Per medical records, he was hospitalized in 2012 for alcohol withdrawal and at that time had reported that he was drinking about 6 beers per day.  He had an ED visit in 2017 related to alcohol intoxication.  He is somewhat vague with his history of alcohol consumption during the visit today, but he does admit that he would drink "a few drinks per day, several days per week, a while ago."  He reports that he is currently drinking 1-2 alcoholic beverages per week, last drink was 1 week ago.  He denies any B-symptoms such as fever, chills, night sweats, or unintentional weight loss.  He does not have any frequent infections.  He denies any signs of bleeding such as hematemesis, hematochezia, melena, or epistaxis.  The patient is noted to have some mild generalized tremors during exam today.  He reports that he did not eat breakfast this morning and that this "happens sometimes when he skips a meal."  He reports that he feels better after drinking soda during his visit here.  Family history is significant for daughter with iron-deficiency anemia.  No family history of blood or bone marrow cancer.  His mother has ovarian cancer and his paternal grandmother had lung  cancer.  His PMH is notable for COPD, hypertension, rheumatoid arthritis, and GERD.  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  He has a history of heavy alcohol consumption, but has cut back to 1-2 drinks per week.  He is a former smoker, smoked 1.5 packs/day x 21 years, quit in 2016.  He denies any history of drug use.   MEDICAL HISTORY:  Past Medical History:  Diagnosis Date   Anxiety    COPD (chronic obstructive pulmonary disease) (HCC)    Depression    Hypertension    Neuropathic pain     SURGICAL HISTORY: Past Surgical History:  Procedure Laterality Date   WISDOM TOOTH EXTRACTION      SOCIAL HISTORY: Social History   Socioeconomic History   Marital status: Married    Spouse name: Not on file   Number of children: 3   Years of education: Not on file   Highest education level: Some college, no degree  Occupational History   Not on file  Tobacco Use   Smoking status: Former    Packs/day: 1.50    Years: 21.00    Pack years: 31.50    Types: Cigarettes    Quit date: 02/17/2014    Years since quitting: 7.0   Smokeless tobacco: Never  Vaping Use   Vaping Use: Never used  Substance and Sexual Activity   Alcohol use: Yes    Alcohol/week: 2.0 standard drinks    Types: 2 Glasses of wine per week  Comment: occassional 2 glasses of wine a week   Drug use: No   Sexual activity: Yes    Comment: has one sexual patner  Other Topics Concern   Not on file  Social History Narrative   Not on file   Social Determinants of Health   Financial Resource Strain: Not on file  Food Insecurity: No Food Insecurity   Worried About Charity fundraiser in the Last Year: Never true   Ran Out of Food in the Last Year: Never true  Transportation Needs: Not on file  Physical Activity: Not on file  Stress: Not on file  Social Connections: Not on file  Intimate Partner Violence: Not on file    FAMILY HISTORY: Family History   Problem Relation Age of Onset   Ovarian cancer Mother 15   Stomach cancer Mother 10   Diabetes Mother    Hypertension Mother    Huntington's disease Father        died at 66   Huntington's disease Brother    Colon cancer Maternal Grandmother    Heart attack Maternal Grandfather    Emphysema Paternal Grandmother    Rheum arthritis Paternal Grandmother    Lung cancer Paternal Grandmother    Heart attack Paternal Grandfather     ALLERGIES:  is allergic to epinephrine.  MEDICATIONS:  Current Outpatient Medications  Medication Sig Dispense Refill   albuterol (PROVENTIL) (2.5 MG/3ML) 0.083% nebulizer solution Take 3 mLs (2.5 mg total) by nebulization every 4 (four) hours as needed for wheezing or shortness of breath.     albuterol (VENTOLIN HFA) 108 (90 Base) MCG/ACT inhaler INHALE 2 PUFFS INTO THE LUNGS EVERY 6 HOURS AS NEEDED FOR WHEEZING OR SHORTNESS OF BREATH 8.5 g 0   budesonide-formoterol (SYMBICORT) 160-4.5 MCG/ACT inhaler INHALE 2 PUFFS INTO THE LUNGS TWICE DAILY 30.6 g 3   calcium carbonate (OSCAL) 1500 (600 Ca) MG TABS tablet Take 1 tablet (1,500 mg total) by mouth 2 (two) times daily with a meal. (Patient not taking: Reported on 02/14/2021) 30 tablet 2   famotidine (PEPCID) 20 MG tablet Take 20 mg by mouth 2 (two) times daily. (Patient not taking: Reported on 02/14/2021)     Ibuprofen 200 MG CAPS Take 200-400 mg by mouth every 8 (eight) hours as needed.     levocetirizine (XYZAL) 5 MG tablet Take 5 mg by mouth daily as needed.     Loperamide HCl (IMODIUM PO) Take by mouth. (Patient not taking: Reported on 02/14/2021)     Magnesium Oxide (MAGNESIUM OXIDE 400) 240 MG PACK Take 400 mg by mouth daily. 10 each 0   Multiple Vitamin (MULTIVITAMIN) tablet Take 1 tablet by mouth every evening.      omeprazole (PRILOSEC) 20 MG capsule Take 20 mg by mouth daily.     potassium chloride 20 MEQ/15ML (10%) SOLN TAKE 15 MLS BY MOUTH EVERY DAY. ON THE FIRST DAY TAKE 30 MLS THEN 15 MLS  THEREAFTER. 225 mL 0   telmisartan (MICARDIS) 40 MG tablet Take 1 tablet (40 mg total) by mouth daily. 30 tablet 11   Current Facility-Administered Medications  Medication Dose Route Frequency Provider Last Rate Last Admin   feeding supplement (ENSURE ENLIVE / ENSURE PLUS) liquid 237 mL  237 mL Oral BID BM Paseda, Folashade R, FNP        REVIEW OF SYSTEMS:   Review of Systems  Constitutional:  Positive for fatigue (energy 50%). Negative for appetite change, chills, diaphoresis, fever and unexpected weight change.  HENT:   Negative for lump/mass and nosebleeds.   Eyes:  Negative for eye problems.  Respiratory:  Positive for cough (COPD) and shortness of breath (COPD). Negative for hemoptysis.   Cardiovascular:  Negative for chest pain, leg swelling and palpitations.  Gastrointestinal:  Negative for abdominal pain, blood in stool, constipation, diarrhea, nausea and vomiting.  Genitourinary:  Negative for hematuria.   Skin: Negative.   Neurological:  Negative for dizziness, headaches and light-headedness.  Hematological:  Does not bruise/bleed easily.     PHYSICAL EXAMINATION: ECOG PERFORMANCE STATUS: 1 - Symptomatic but completely ambulatory  There were no vitals filed for this visit. There were no vitals filed for this visit.  Physical Exam Constitutional:      Appearance: Normal appearance. He is underweight.  HENT:     Head: Normocephalic and atraumatic.     Mouth/Throat:     Mouth: Mucous membranes are moist.  Eyes:     Extraocular Movements: Extraocular movements intact.     Pupils: Pupils are equal, round, and reactive to light.  Cardiovascular:     Rate and Rhythm: Regular rhythm. Tachycardia present.     Pulses: Normal pulses.     Heart sounds: Normal heart sounds.  Pulmonary:     Effort: Pulmonary effort is normal.     Breath sounds: Normal breath sounds.  Abdominal:     General: Bowel sounds are normal.     Palpations: Abdomen is soft.     Tenderness: There is  no abdominal tenderness.  Musculoskeletal:        General: No swelling.     Right lower leg: No edema.     Left lower leg: No edema.  Lymphadenopathy:     Cervical: No cervical adenopathy.  Skin:    General: Skin is warm and dry.  Neurological:     General: No focal deficit present.     Mental Status: He is alert and oriented to person, place, and time.     Motor: Tremor present.  Psychiatric:        Mood and Affect: Mood normal.        Behavior: Behavior normal.      LABORATORY DATA:  I have reviewed the data as listed Recent Results (from the past 2160 hour(s))  CMP14+EGFR     Status: Abnormal   Collection Time: 01/03/21 11:51 AM  Result Value Ref Range   Glucose 60 (L) 70 - 99 mg/dL   BUN 7 6 - 24 mg/dL   Creatinine, Ser 0.94 0.76 - 1.27 mg/dL   eGFR 101 >59 mL/min/1.73   BUN/Creatinine Ratio 7 (L) 9 - 20   Sodium 142 134 - 144 mmol/L   Potassium 3.2 (L) 3.5 - 5.2 mmol/L   Chloride 96 96 - 106 mmol/L   CO2 24 20 - 29 mmol/L   Calcium 8.6 (L) 8.7 - 10.2 mg/dL   Total Protein 6.2 6.0 - 8.5 g/dL   Albumin 3.5 (L) 4.0 - 5.0 g/dL   Globulin, Total 2.7 1.5 - 4.5 g/dL   Albumin/Globulin Ratio 1.3 1.2 - 2.2   Bilirubin Total 1.2 0.0 - 1.2 mg/dL   Alkaline Phosphatase 125 (H) 44 - 121 IU/L   AST 112 (H) 0 - 40 IU/L   ALT 52 (H) 0 - 44 IU/L  Hepatic function panel     Status: Abnormal   Collection Time: 01/03/21 11:52 AM  Result Value Ref Range   Total Protein 6.1 6.0 - 8.5 g/dL   Albumin 3.6 (  L) 4.0 - 5.0 g/dL   Bilirubin Total 1.2 0.0 - 1.2 mg/dL   Bilirubin, Direct 0.78 (H) 0.00 - 0.40 mg/dL   Alkaline Phosphatase 128 (H) 44 - 121 IU/L   AST 116 (H) 0 - 40 IU/L   ALT 54 (H) 0 - 44 IU/L  B12 and Folate Panel     Status: None   Collection Time: 01/03/21 11:52 AM  Result Value Ref Range   Vitamin B-12 922 232 - 1,245 pg/mL   Folate 5.1 >3.0 ng/mL    Comment: A serum folate concentration of less than 3.1 ng/mL is considered to represent clinical deficiency.    Specimen status report     Status: None   Collection Time: 01/03/21 11:52 AM  Result Value Ref Range   specimen status report Comment     Comment: Written Authorization Written Authorization Written Authorization Received. Authorization received from [facility] 01-06-2021 Logged by Leona Carry   CBC     Status: Abnormal   Collection Time: 01/03/21 11:53 AM  Result Value Ref Range   WBC 7.2 3.4 - 10.8 x10E3/uL   RBC 3.37 (L) 4.14 - 5.80 x10E6/uL   Hemoglobin 13.7 13.0 - 17.7 g/dL   Hematocrit 38.8 37.5 - 51.0 %   MCV 115 (H) 79 - 97 fL   MCH 40.7 (H) 26.6 - 33.0 pg   MCHC 35.3 31.5 - 35.7 g/dL   RDW 13.3 11.6 - 15.4 %   Platelets 186 150 - 450 x10E3/uL  HIV antibody (with reflex)     Status: None   Collection Time: 01/03/21 11:54 AM  Result Value Ref Range   HIV Screen 4th Generation wRfx Non Reactive Non Reactive    Comment: HIV Negative HIV-1/HIV-2 antibodies and HIV-1 p24 antigen were NOT detected. There is no laboratory evidence of HIV infection.   Hepatitis C Antibody     Status: None   Collection Time: 01/03/21 11:54 AM  Result Value Ref Range   Hep C Virus Ab <0.1 0.0 - 0.9 s/co ratio    Comment:                                   Negative:     < 0.8                              Indeterminate: 0.8 - 0.9                                   Positive:     > 0.9  HCV antibody alone does not differentiate between  previous resolved infection and active infection.  The CDC and current clinical guidelines recommend  that a positive HCV antibody result be followed up  with an HCV RNA test to support the diagnosis of  acute HCV infection. [facility] offers Hepatitis C  Virus (HCV) RNA, Diagnosis, NAA (263785) and  Hepatitis C Virus (HCV) Antibody with reflex to  Quantitative Real-time PCR (144050).   POCT Occult Blood Stool     Status: None   Collection Time: 01/11/21  4:01 PM  Result Value Ref Range   Fecal Occult Blood, POC Negative Negative   Card #1 Date  01/03/21    Card #2 Fecal Occult Blod, POC Negative    Card #2 Date 01/04/2021  Card #3 Fecal Occult Blood, POC Negative    Card #3 Date 01/05/2021   CMP14+EGFR     Status: Abnormal   Collection Time: 01/31/21 11:26 AM  Result Value Ref Range   Glucose 87 70 - 99 mg/dL   BUN 9 6 - 24 mg/dL   Creatinine, Ser 0.68 (L) 0.76 - 1.27 mg/dL   eGFR 116 >59 mL/min/1.73   BUN/Creatinine Ratio 13 9 - 20   Sodium 140 134 - 144 mmol/L   Potassium 2.9 (L) 3.5 - 5.2 mmol/L   Chloride 97 96 - 106 mmol/L   CO2 27 20 - 29 mmol/L   Calcium 7.8 (L) 8.7 - 10.2 mg/dL   Total Protein 5.8 (L) 6.0 - 8.5 g/dL   Albumin 3.4 (L) 4.0 - 5.0 g/dL   Globulin, Total 2.4 1.5 - 4.5 g/dL   Albumin/Globulin Ratio 1.4 1.2 - 2.2   Bilirubin Total 1.3 (H) 0.0 - 1.2 mg/dL   Alkaline Phosphatase 110 44 - 121 IU/L   AST 106 (H) 0 - 40 IU/L   ALT 39 0 - 44 IU/L  Magnesium     Status: Abnormal   Collection Time: 01/31/21 11:26 AM  Result Value Ref Range   Magnesium 1.3 (L) 1.6 - 2.3 mg/dL  Specimen status report     Status: None   Collection Time: 01/31/21 11:26 AM  Result Value Ref Range   specimen status report Comment     Comment: Written Authorization Written Authorization Written Authorization Received. Authorization received from [place] 02-02-2021 Logged by Pecolia Ades   Lipid Profile     Status: None   Collection Time: 02/19/21 11:41 AM  Result Value Ref Range   Cholesterol, Total 120 100 - 199 mg/dL   Triglycerides 135 0 - 149 mg/dL   HDL 46 >39 mg/dL   VLDL Cholesterol Cal 24 5 - 40 mg/dL   LDL Chol Calc (NIH) 50 0 - 99 mg/dL   Chol/HDL Ratio 2.6 0.0 - 5.0 ratio    Comment:                                   T. Chol/HDL Ratio                                             Men  Women                               1/2 Avg.Risk  3.4    3.3                                   Avg.Risk  5.0    4.4                                2X Avg.Risk  9.6    7.1                                3X Avg.Risk  23.4   11.0   Vitamin D (25 hydroxy)     Status: None  Collection Time: 02/19/21 11:41 AM  Result Value Ref Range   Vit D, 25-Hydroxy 43.8 30.0 - 100.0 ng/mL    Comment: Vitamin D deficiency has been defined by the [place] practice guideline as a level of serum 25-OH vitamin D less than 20 ng/mL (1,2). The Endocrine Society went on to further define vitamin D insufficiency as a level between 21 and 29 ng/mL (2). 1. IOM (Institute of Medicine). 2010. Dietary reference    intakes for calcium and D. [place]: The    [employer]. 2. Holick MF, Binkley Woodward, Bischoff-Ferrari HA, et al.    Evaluation, treatment, and prevention of vitamin D    deficiency: an Endocrine Society clinical practice    guideline. JCEM. 2011 Jul; 96(7):1911-30.   Magnesium     Status: None   Collection Time: 02/19/21 11:42 AM  Result Value Ref Range   Magnesium 1.7 1.6 - 2.3 mg/dL    RADIOGRAPHIC STUDIES: I have personally reviewed the radiological images as listed and agreed with the findings in the report. No results found.  ASSESSMENT & PLAN: 1.  Macrocytosis without anemia - Seen at the request of his primary care provider (NP Vena Rua) for evaluation of macrocytosis. - Review of recent lab work shows CBC (01/03/2021) with Hgb 13.7 and MCV 115; normal WBC, platelets, and differential.  Prior to this, most recent lab work is from November 2017, and shows MCV 99.6; no anemia noted on available lab work. - Other relevant labs: Normal TSH 1.750 in September 2022.  Hepatitis C and HIV were negative.  Nutritional panel (01/03/2021) showed normal folate 5.1 and normal vitamin B12 at 922.  Most recent CMP (01/31/2021) showed evidence of liver dysfunction with AST 106, ALT 39, and bilirubin 1.3. - Abdominal ultrasound (01/09/2021): Hepatic steatosis/fatty liver disease, spleen size and appearance within normal limits - He is not on any medications  classically associated with macrocytosis. - History of heavy alcohol consumption several years ago, but currently drinks only 1-2 alcoholic beverages per week - No significant chemical exposure - Patient's daughter has iron deficiency anemia.  No other history of blood disorder or bone marrow conditions. - We discussed the differential diagnosis of macrocytosis, which can include medication effects, nutritional deficiencies, alcoholism, liver disease, thyroid disease, and bone marrow dysfunction such as MDS. - PLAN: In addition to labs and imaging already obtained by PCP, we will check labs today with repeat CBC, reticulocytes, RBC folate, vitamin B12, homocystine, TSH/free T4, methylmalonic acid, hepatitis B, and peripheral smear. - RTC in 2 weeks to discuss results and next steps. - If above testing is normal, would suspect that macrocytosis is due to fatty liver disease and would recommend a course of watchful waiting regarding blood counts.  2.  Other history - PMH: COPD, hypertension, rheumatoid arthritis, and GERD.  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  He has a history of heavy alcohol consumption, but has cut back to 1-2 drinks per week.  He is a former smoker, smoked 1.5 packs/day x 21 years, quit in 2016.  He denies any history of drug use. - FAMILY: Family history is significant for daughter with iron-deficiency anemia.  No family history of blood or bone marrow cancer.  His mother has ovarian cancer and his paternal grandmother had lung cancer.   PLAN SUMMARY & DISPOSITION: Labs today RTC in 2 weeks to discuss results  All questions  were answered. The patient knows to call the clinic with any problems, questions or concerns.   Medical decision making: Moderate  Time spent on visit: I spent 25 minutes counseling the patient face to face. The total time spent in the appointment was 40 minutes and more than 50% was on  counseling.  I, Tarri Abernethy PA-C, have seen this patient in conjunction with Dr. Derek Jack.  Greater than 50% of visit was performed by Dr. Delton Coombes.     Harriett Rush, PA-C 03/11/2021 1:12 PM  DR. Dajohn Ellender: I have independently evaluated this patient and formulated my assessment and plan.  I agree with HPI written by Casey Burkitt, PA-C.  Patient seen for further work-up of macrocytosis.  Will evaluate for nutritional deficiencies.  Ultrasound of the liver on 01/09/2021 showed hepatic steatosis which could be contributing to his macrocytosis.

## 2021-03-11 ENCOUNTER — Encounter (HOSPITAL_COMMUNITY): Payer: Self-pay | Admitting: Hematology

## 2021-03-11 ENCOUNTER — Other Ambulatory Visit: Payer: Self-pay

## 2021-03-11 ENCOUNTER — Inpatient Hospital Stay (HOSPITAL_COMMUNITY): Payer: Federal, State, Local not specified - PPO | Attending: Hematology | Admitting: Hematology

## 2021-03-11 VITALS — BP 149/99 | HR 95 | Temp 98.8°F | Resp 18 | Ht 65.35 in | Wt 106.9 lb

## 2021-03-11 DIAGNOSIS — Z87891 Personal history of nicotine dependence: Secondary | ICD-10-CM | POA: Insufficient documentation

## 2021-03-11 DIAGNOSIS — F1020 Alcohol dependence, uncomplicated: Secondary | ICD-10-CM | POA: Diagnosis not present

## 2021-03-11 DIAGNOSIS — Z832 Family history of diseases of the blood and blood-forming organs and certain disorders involving the immune mechanism: Secondary | ICD-10-CM | POA: Insufficient documentation

## 2021-03-11 DIAGNOSIS — Z8041 Family history of malignant neoplasm of ovary: Secondary | ICD-10-CM | POA: Diagnosis not present

## 2021-03-11 DIAGNOSIS — D7589 Other specified diseases of blood and blood-forming organs: Secondary | ICD-10-CM | POA: Diagnosis not present

## 2021-03-11 DIAGNOSIS — K760 Fatty (change of) liver, not elsewhere classified: Secondary | ICD-10-CM | POA: Insufficient documentation

## 2021-03-11 DIAGNOSIS — Z801 Family history of malignant neoplasm of trachea, bronchus and lung: Secondary | ICD-10-CM | POA: Insufficient documentation

## 2021-03-11 NOTE — Patient Instructions (Signed)
[place] at [facility] Discharge Instructions  You were seen today by Dr. Delton Coombes and Tarri Abernethy PA-C for your macrocytosis (enlarged blood cells).  As we discussed, this can be related to various vitamin/mineral deficiencies, thyroid problems, liver disease, alcohol consumption, and in some rare cases bone marrow disorders.  We will check several lab tests from your blood work, and we will have you follow back in 2 weeks to discuss these results.    Thank you for choosing [place] at [facility] to provide your oncology and hematology care.  To afford each patient quality time with our provider, please arrive at least 15 minutes before your scheduled appointment time.   If you have a lab appointment with the [facility] please come in thru the Main Entrance and check in at the main information desk.  You need to re-schedule your appointment should you arrive 10 or more minutes late.  We strive to give you quality time with our providers, and arriving late affects you and other patients whose appointments are after yours.  Also, if you no show three or more times for appointments you may be dismissed from the clinic at the providers discretion.     Again, thank you for choosing [facility].  Our hope is that these requests will decrease the amount of time that you wait before being seen by our physicians.       _____________________________________________________________  Should you have questions after your visit to [facility], please contact our office at [phone] and follow the prompts.  Our office hours are 8:00 a.m. and 4:30 p.m. Monday - Friday.  Please note that voicemails left after 4:00 p.m. may not be returned until the following business day.  We are closed weekends and major holidays.  You do have access to a nurse 24-7, just call the main number to the clinic [phone] and do not press  any options, hold on the line and a nurse will answer the phone.    For prescription refill requests, have your pharmacy contact our office and allow 72 hours.    Due to Covid, you will need to wear a mask upon entering the hospital. If you do not have a mask, a mask will be given to you at the Main Entrance upon arrival. For doctor visits, patients may have 1 support person age 4 or older with them. For treatment visits, patients can not have anyone with them due to social distancing guidelines and our immunocompromised population.

## 2021-03-12 ENCOUNTER — Inpatient Hospital Stay (HOSPITAL_COMMUNITY): Payer: Federal, State, Local not specified - PPO

## 2021-03-12 DIAGNOSIS — Z832 Family history of diseases of the blood and blood-forming organs and certain disorders involving the immune mechanism: Secondary | ICD-10-CM | POA: Diagnosis not present

## 2021-03-12 DIAGNOSIS — D7589 Other specified diseases of blood and blood-forming organs: Secondary | ICD-10-CM | POA: Diagnosis not present

## 2021-03-12 DIAGNOSIS — Z8041 Family history of malignant neoplasm of ovary: Secondary | ICD-10-CM | POA: Diagnosis not present

## 2021-03-12 DIAGNOSIS — K760 Fatty (change of) liver, not elsewhere classified: Secondary | ICD-10-CM | POA: Diagnosis not present

## 2021-03-12 DIAGNOSIS — Z801 Family history of malignant neoplasm of trachea, bronchus and lung: Secondary | ICD-10-CM | POA: Diagnosis not present

## 2021-03-12 DIAGNOSIS — F1020 Alcohol dependence, uncomplicated: Secondary | ICD-10-CM | POA: Diagnosis not present

## 2021-03-12 DIAGNOSIS — Z87891 Personal history of nicotine dependence: Secondary | ICD-10-CM | POA: Diagnosis not present

## 2021-03-12 LAB — CBC WITH DIFFERENTIAL/PLATELET
Abs Immature Granulocytes: 0.01 10*3/uL (ref 0.00–0.07)
Basophils Absolute: 0 10*3/uL (ref 0.0–0.1)
Basophils Relative: 0 %
Eosinophils Absolute: 0 10*3/uL (ref 0.0–0.5)
Eosinophils Relative: 1 %
HCT: 43.1 % (ref 39.0–52.0)
Hemoglobin: 15 g/dL (ref 13.0–17.0)
Immature Granulocytes: 0 %
Lymphocytes Relative: 25 %
Lymphs Abs: 1.5 10*3/uL (ref 0.7–4.0)
MCH: 39.2 pg — ABNORMAL HIGH (ref 26.0–34.0)
MCHC: 34.8 g/dL (ref 30.0–36.0)
MCV: 112.5 fL — ABNORMAL HIGH (ref 80.0–100.0)
Monocytes Absolute: 0.4 10*3/uL (ref 0.1–1.0)
Monocytes Relative: 6 %
Neutro Abs: 4.2 10*3/uL (ref 1.7–7.7)
Neutrophils Relative %: 68 %
Platelets: 147 10*3/uL — ABNORMAL LOW (ref 150–400)
RBC: 3.83 MIL/uL — ABNORMAL LOW (ref 4.22–5.81)
RDW: 12.6 % (ref 11.5–15.5)
WBC: 6.2 10*3/uL (ref 4.0–10.5)
nRBC: 0 % (ref 0.0–0.2)

## 2021-03-12 LAB — HEPATITIS B SURFACE ANTIGEN: Hepatitis B Surface Ag: NONREACTIVE

## 2021-03-12 LAB — RETICULOCYTES
Immature Retic Fract: 14.8 % (ref 2.3–15.9)
RBC.: 3.94 MIL/uL — ABNORMAL LOW (ref 4.22–5.81)
Retic Count, Absolute: 52 10*3/uL (ref 19.0–186.0)
Retic Ct Pct: 1.3 % (ref 0.4–3.1)

## 2021-03-12 LAB — TSH: TSH: 2.044 u[IU]/mL (ref 0.350–4.500)

## 2021-03-12 LAB — VITAMIN B12: Vitamin B-12: 376 pg/mL (ref 180–914)

## 2021-03-12 LAB — T4, FREE: Free T4: 0.79 ng/dL (ref 0.61–1.12)

## 2021-03-12 LAB — HEPATITIS B CORE ANTIBODY, TOTAL: Hep B Core Total Ab: NONREACTIVE

## 2021-03-12 LAB — HEPATITIS B SURFACE ANTIBODY,QUALITATIVE: Hep B S Ab: REACTIVE — AB

## 2021-03-13 LAB — HEMATOLOGY COMMENTS:

## 2021-03-13 LAB — FOLATE RBC
Folate, Hemolysate: 335 ng/mL
Folate, RBC: 796 ng/mL (ref >498)
Hematocrit: 42.1 % (ref 37.5–51.0)

## 2021-03-13 LAB — HOMOCYSTEINE: Homocysteine: 82.1 umol/L — ABNORMAL HIGH (ref 0.0–14.5)

## 2021-03-14 LAB — PATHOLOGIST SMEAR REVIEW

## 2021-03-14 LAB — METHYLMALONIC ACID, SERUM: Methylmalonic Acid, Quantitative: 268 nmol/L (ref 0–378)

## 2021-03-16 ENCOUNTER — Other Ambulatory Visit: Payer: Self-pay | Admitting: Internal Medicine

## 2021-03-25 NOTE — Progress Notes (Signed)
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 male returns for routine follow-up of macrocytosis.  He was seen for initial consultation by Dr. Delton Coombes in [facility] Abernethy PA-C on 03/11/2021.  At today's visit, he reports feeling fairly well.  He has not had any changes in his health status since his visit 2 weeks ago.  He denies any B-symptoms such as fever, chills, night sweats, or unintentional weight loss.  He does not have any frequent infections.  He denies any signs of bleeding such as hematemesis, hematochezia, melena, or epistaxis.  He reports drinking alcohol 1-2 times per week.  He has 60% energy and 100% appetite. He endorses that he is maintaining a stable weight.   REVIEW OF SYSTEMS:  Review of Systems  Constitutional:  Positive for fatigue (60%). Negative for appetite change, chills, diaphoresis, fever and unexpected weight change.  HENT:   Negative for lump/mass and nosebleeds.   Eyes:  Negative for eye problems.  Respiratory:  Positive for cough and shortness of breath. Negative for hemoptysis.   Cardiovascular:  Negative for chest pain, leg swelling and palpitations.  Gastrointestinal:  Negative for abdominal pain, blood in stool, constipation, diarrhea, nausea and vomiting.  Genitourinary:  Negative for hematuria.   Skin: Negative.   Neurological:  Negative for dizziness, headaches and light-headedness.  Hematological:  Does not bruise/bleed easily.     PAST MEDICAL/SURGICAL HISTORY:  Past Medical History:  Diagnosis Date   Anxiety    COPD (chronic obstructive pulmonary disease) (HCC)    Depression    Hypertension    Neuropathic  pain    Past Surgical History:  Procedure Laterality Date   WISDOM TOOTH EXTRACTION       SOCIAL HISTORY:  Social History   Socioeconomic History   Marital status: Married    Spouse name: Not on file   Number of children: 3   Years of education: Not on file   Highest education level: Some college, no degree  Occupational History   Not on file  Tobacco Use   Smoking status: Former    Packs/day: 1.50    Years: 21.00    Pack years: 31.50    Types: Cigarettes    Quit date: 02/17/2014    Years since quitting: 7.1   Smokeless tobacco: Never  Vaping Use   Vaping Use: Never used  Substance and Sexual Activity   Alcohol use: Yes    Alcohol/week: 2.0 standard drinks    Types: 2 Glasses of wine per week    Comment: occassional 2 glasses of wine a week   Drug use: No   Sexual activity: Yes    Comment: has one sexual patner  Other Topics Concern   Not on file  Social History Narrative   Not on file   Social Determinants of Health   Financial Resource Strain: Not on file  Food Insecurity: No Food Insecurity   Worried About Charity fundraiser in the Last Year: Never true   Ran Out of Food in the Last Year: Never true  Transportation Needs: Not on file  Physical Activity: Not on file  Stress: Not on file  Social Connections: Not on file  Intimate Partner Violence: Not on file    FAMILY HISTORY:  Family History  Problem Relation Age of Onset   Ovarian cancer Mother 56   Stomach cancer Mother 22   Diabetes Mother    Hypertension Mother    Huntington's disease Father        died at 28   Huntington's disease Brother    Colon cancer Maternal Grandmother    Heart attack Maternal Grandfather    Emphysema Paternal Grandmother    Rheum arthritis Paternal Grandmother    Lung cancer Paternal Grandmother    Heart attack Paternal Grandfather     CURRENT MEDICATIONS:  Outpatient Encounter Medications as of 03/26/2021  Medication Sig Note   albuterol (PROVENTIL) (2.5  MG/3ML) 0.083% nebulizer solution Take 3 mLs (2.5 mg total) by nebulization every 4 (four) hours as needed for wheezing or shortness of breath. (Patient not taking: Reported on 03/11/2021) 02/14/2021: Hasn't needed in 2-3 years   albuterol (VENTOLIN HFA) 108 (90 Base) MCG/ACT inhaler INHALE 2 PUFFS INTO THE LUNGS EVERY 6 HOURS AS NEEDED FOR WHEEZING OR SHORTNESS OF BREATH (Patient not taking: Reported on 03/11/2021) 02/14/2021: Uses twice weekly   budesonide-formoterol (SYMBICORT) 160-4.5 MCG/ACT inhaler INHALE 2 PUFFS INTO THE LUNGS FIRST THING IN THE MORNING THEN INHALE 2 PUFFS BY MOUTH 12 HOURS LATER    famotidine (PEPCID) 20 MG tablet Take 20 mg by mouth 2 (two) times daily.    Ibuprofen 200 MG CAPS Take 200-400 mg by mouth every 8 (eight) hours as needed. (Patient not taking: Reported on 03/11/2021)    levocetirizine (XYZAL) 5 MG tablet Take 5 mg by mouth daily as needed.    Loperamide HCl (IMODIUM PO) Take by mouth.    Multiple Vitamin (MULTIVITAMIN) tablet Take 1 tablet by mouth every evening.     omeprazole (PRILOSEC) 20 MG capsule Take 20 mg by mouth daily.    telmisartan (MICARDIS) 40 MG tablet Take 1 tablet (40 mg total) by mouth daily.    Facility-Administered Encounter Medications as of 03/26/2021  Medication   feeding supplement (ENSURE ENLIVE / ENSURE PLUS) liquid 237 mL    ALLERGIES:  Allergies  Allergen Reactions   Epinephrine     Uncontrollable shaking with epinephrine in gums for dental work     PHYSICAL EXAM:  ECOG PERFORMANCE STATUS: 1 - Symptomatic but completely ambulatory  There were no vitals filed for this visit. There were no vitals filed for this visit. Physical Exam Constitutional:      Appearance: Normal appearance. He is underweight.  HENT:     Head: Normocephalic and atraumatic.     Mouth/Throat:     Mouth: Mucous membranes are moist.  Eyes:     Extraocular Movements: Extraocular movements intact.     Pupils: Pupils are equal, round, and reactive to  light.  Cardiovascular:     Rate and Rhythm: Regular rhythm. Tachycardia present.     Pulses: Normal pulses.     Heart sounds: Normal heart sounds.  Pulmonary:     Effort: Pulmonary effort is normal.     Comments: Faintly coarse breath sounds bilaterally Abdominal:     General: Bowel sounds are normal.     Palpations: Abdomen is soft.     Tenderness: There is no abdominal tenderness.  Musculoskeletal:        General: No swelling.     Right lower leg: No edema.     Left lower leg: No edema.  Lymphadenopathy:     Cervical: No cervical  adenopathy.  Skin:    General: Skin is warm and dry.  Neurological:     General: No focal deficit present.     Mental Status: He is alert and oriented to person, place, and time.     Motor: No tremor.  Psychiatric:        Mood and Affect: Mood normal.        Behavior: Behavior normal.     LABORATORY DATA:  I have reviewed the labs as listed.  CBC    Component Value Date/Time   WBC 6.2 03/12/2021 1122   RBC 3.94 (L) 03/12/2021 1123   RBC 3.83 (L) 03/12/2021 1122   HGB 15.0 03/12/2021 1122   HGB 13.7 01/03/2021 1153   HCT 42.1 03/12/2021 1123   HCT 43.1 03/12/2021 1122   PLT 147 (L) 03/12/2021 1122   PLT 186 01/03/2021 1153   MCV 112.5 (H) 03/12/2021 1122   MCV 115 (H) 01/03/2021 1153   MCH 39.2 (H) 03/12/2021 1122   MCHC 34.8 03/12/2021 1122   RDW 12.6 03/12/2021 1122   RDW 13.3 01/03/2021 1153   LYMPHSABS 1.5 03/12/2021 1122   LYMPHSABS 2.7 11/07/2020 1554   MONOABS 0.4 03/12/2021 1122   EOSABS 0.0 03/12/2021 1122   EOSABS 0.1 11/07/2020 1554   BASOSABS 0.0 03/12/2021 1122   BASOSABS 0.0 11/07/2020 1554   CMP Latest Ref Rng & Units 01/31/2021 01/03/2021 01/03/2021  Glucose 70 - 99 mg/dL 87 - 60(L)  BUN 6 - 24 mg/dL 9 - 7  Creatinine 0.76 - 1.27 mg/dL 0.68(L) - 0.94  Sodium 134 - 144 mmol/L 140 - 142  Potassium 3.5 - 5.2 mmol/L 2.9(L) - 3.2(L)  Chloride 96 - 106 mmol/L 97 - 96  CO2 20 - 29 mmol/L 27 - 24  Calcium 8.7 - 10.2  mg/dL 7.8(L) - 8.6(L)  Total Protein 6.0 - 8.5 g/dL 5.8(L) 6.1 6.2  Total Bilirubin 0.0 - 1.2 mg/dL 1.3(H) 1.2 1.2  Alkaline Phos 44 - 121 IU/L 110 128(H) 125(H)  AST 0 - 40 IU/L 106(H) 116(H) 112(H)  ALT 0 - 44 IU/L 39 54(H) 52(H)    DIAGNOSTIC IMAGING:  I have independently reviewed the relevant imaging and discussed with the patient.  ASSESSMENT & PLAN: 1.  Macrocytosis without anemia - Seen at the request of his primary care provider (NP Vena Rua) for evaluation of macrocytosis. - Review of recent lab work shows CBC (01/03/2021) with Hgb 13.7 and MCV 115; normal WBC, platelets, and differential.  Prior to this, most recent lab work is from November 2017, and shows MCV 99.6; no anemia noted on available lab work. - Other relevant labs: Normal TSH 1.750 in September 2022.  Hepatitis C and HIV were negative.  Nutritional panel (01/03/2021) showed normal folate 5.1 and normal vitamin B12 at 922.  Most recent CMP (01/31/2021) showed evidence of liver dysfunction with AST 106, ALT 39, and bilirubin 1.3. - Abdominal ultrasound (01/09/2021): Hepatic steatosis/fatty liver disease, spleen size and appearance within normal limits - He is not on any medications classically associated with macrocytosis. - History of heavy alcohol consumption several years ago, but currently drinks only 1-2 alcoholic beverages per week - No significant chemical exposure - Patient's daughter has iron deficiency anemia.  No other history of blood disorder or bone marrow conditions. - Hematology work-up (03/12/2021): Hgb 15.0/MCV 112.5, platelets 147.  Normal B12/methylmalonic acid.  Normal folate, but elevated homocystine at 82.1.  No reticulocytosis.  Normal TSH/T4.  Hepatitis B consistent with immunity, negative for  acute infection. - He denies any B symptoms such as fever, chills, night sweats, unintentional weight loss.   - Differential diagnosis of macrocytosis favors macrocytosis in the setting of fatty liver  disease, alcohol consumption, and folate deficiency (evidenced by elevated homocystine). - A course of watchful waiting is reasonable at this time. - PLAN: Recommend daily folic acid supplement due to elevated homocysteine. - Recommend alcohol cessation. - We will repeat CBC, folate, and homocystine in 4 months. - RTC after labs in 4 months.   2.  Other history - PMH: COPD, hypertension, rheumatoid arthritis, and GERD.  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  He has a history of heavy alcohol consumption, but has cut back to 1-2 drinks per week.  He is a former smoker, smoked 1.5 packs/day x 21 years, quit in 2016.  He denies any history of drug use. - FAMILY: Family history is significant for daughter with iron-deficiency anemia.  No family history of blood or bone marrow cancer.  His mother has ovarian cancer and his paternal grandmother had lung cancer.   PLAN SUMMARY & DISPOSITION: Patient instructed to start folic acid supplement and stop alcohol consumption. - Repeat labs and RTC in 4 months  All questions were answered. The patient knows to call the clinic with any problems, questions or concerns.  Medical decision making: Low  Time spent on visit: I spent 15 minutes counseling the patient face to face. The total time spent in the appointment was 25 minutes and more than 50% was on counseling.   Ethan Rush, PA-C  03/26/2021 9:54 AM

## 2021-03-26 ENCOUNTER — Other Ambulatory Visit: Payer: Self-pay

## 2021-03-26 ENCOUNTER — Inpatient Hospital Stay (HOSPITAL_COMMUNITY): Payer: Federal, State, Local not specified - PPO | Attending: Hematology | Admitting: Physician Assistant

## 2021-03-26 VITALS — BP 148/88 | HR 107 | Temp 98.9°F | Resp 18 | Ht 65.0 in | Wt 107.8 lb

## 2021-03-26 DIAGNOSIS — K219 Gastro-esophageal reflux disease without esophagitis: Secondary | ICD-10-CM | POA: Insufficient documentation

## 2021-03-26 DIAGNOSIS — K760 Fatty (change of) liver, not elsewhere classified: Secondary | ICD-10-CM | POA: Diagnosis not present

## 2021-03-26 DIAGNOSIS — I10 Essential (primary) hypertension: Secondary | ICD-10-CM | POA: Diagnosis not present

## 2021-03-26 DIAGNOSIS — Z79899 Other long term (current) drug therapy: Secondary | ICD-10-CM | POA: Diagnosis not present

## 2021-03-26 DIAGNOSIS — G629 Polyneuropathy, unspecified: Secondary | ICD-10-CM | POA: Insufficient documentation

## 2021-03-26 DIAGNOSIS — M069 Rheumatoid arthritis, unspecified: Secondary | ICD-10-CM | POA: Insufficient documentation

## 2021-03-26 DIAGNOSIS — Z87891 Personal history of nicotine dependence: Secondary | ICD-10-CM | POA: Diagnosis not present

## 2021-03-26 DIAGNOSIS — Z8041 Family history of malignant neoplasm of ovary: Secondary | ICD-10-CM | POA: Insufficient documentation

## 2021-03-26 DIAGNOSIS — D7589 Other specified diseases of blood and blood-forming organs: Secondary | ICD-10-CM | POA: Insufficient documentation

## 2021-03-26 DIAGNOSIS — J449 Chronic obstructive pulmonary disease, unspecified: Secondary | ICD-10-CM | POA: Insufficient documentation

## 2021-03-26 DIAGNOSIS — Z7951 Long term (current) use of inhaled steroids: Secondary | ICD-10-CM | POA: Insufficient documentation

## 2021-03-26 DIAGNOSIS — Z801 Family history of malignant neoplasm of trachea, bronchus and lung: Secondary | ICD-10-CM | POA: Insufficient documentation

## 2021-03-26 DIAGNOSIS — Z800 Family history of malignant neoplasm of digestive organs: Secondary | ICD-10-CM | POA: Insufficient documentation

## 2021-03-26 NOTE — Patient Instructions (Addendum)
[place] at [facility] Discharge Instructions  You were seen today by Tarri Abernethy PA-C for your enlarged blood cells ("macrocytosis").  This is most likely related to your underlying liver disease ("fatty liver infiltration"), history of alcohol consumption, and borderline low folate.  We would like you to start taking folate (over-the-counter folic acid supplement) once daily.  You should also stop drinking ALL alcohol, since this can also cause abnormal blood cells.  We will recheck your labs in 4 months.  LABS: Return in 4 months for repeat labs  OTHER TESTS: None at this time  MEDICATIONS: Folic acid 561 mcg (0.4 mg) once daily.  (AVAILABLE OVER-THE-COUNTER)  FOLLOW-UP APPOINTMENT: Office visit in 4 months, after labs   Thank you for choosing [place] at [facility] to provide your oncology and hematology care.  To afford each patient quality time with our provider, please arrive at least 15 minutes before your scheduled appointment time.   If you have a lab appointment with the [facility] please come in thru the Main Entrance and check in at the main information desk.  You need to re-schedule your appointment should you arrive 10 or more minutes late.  We strive to give you quality time with our providers, and arriving late affects you and other patients whose appointments are after yours.  Also, if you no show three or more times for appointments you may be dismissed from the clinic at the providers discretion.     Again, thank you for choosing [facility].  Our hope is that these requests will decrease the amount of time that you wait before being seen by our physicians.       _____________________________________________________________  Should you have questions after your visit to [facility], please contact our office at [phone] and follow the prompts.  Our office hours are 8:00 a.m. and  4:30 p.m. Monday - Friday.  Please note that voicemails left after 4:00 p.m. may not be returned until the following business day.  We are closed weekends and major holidays.  You do have access to a nurse 24-7, just call the main number to the clinic [phone] and do not press any options, hold on the line and a nurse will answer the phone.    For prescription refill requests, have your pharmacy contact our office and allow 72 hours.    Due to Covid, you will need to wear a mask upon entering the hospital. If you do not have a mask, a mask will be given to you at the Main Entrance upon arrival. For doctor visits, patients may have 1 support person age 75 or older with them. For treatment visits, patients can not have anyone with them due to social distancing guidelines and our immunocompromised population.

## 2021-03-27 ENCOUNTER — Encounter: Payer: Self-pay | Admitting: Allergy & Immunology

## 2021-03-27 ENCOUNTER — Ambulatory Visit: Payer: Federal, State, Local not specified - PPO | Admitting: Allergy & Immunology

## 2021-03-27 VITALS — BP 142/84 | HR 99 | Temp 98.7°F | Resp 16 | Ht 66.0 in | Wt 107.9 lb

## 2021-03-27 DIAGNOSIS — J3089 Other allergic rhinitis: Secondary | ICD-10-CM | POA: Diagnosis not present

## 2021-03-27 DIAGNOSIS — J449 Chronic obstructive pulmonary disease, unspecified: Secondary | ICD-10-CM | POA: Diagnosis not present

## 2021-03-27 DIAGNOSIS — J302 Other seasonal allergic rhinitis: Secondary | ICD-10-CM

## 2021-03-27 MED ORDER — LEVOCETIRIZINE DIHYDROCHLORIDE 5 MG PO TABS: 5.0000 mg | ORAL_TABLET | Freq: Two times a day (BID) | ORAL | 5 refills | Status: DC | PRN

## 2021-03-27 MED ORDER — AZELASTINE HCL 0.1 % NA SOLN: 2.0000 | Freq: Every day | NASAL | 5 refills | Status: AC

## 2021-03-27 MED ORDER — FLUTICASONE PROPIONATE 50 MCG/ACT NA SUSP: 2.0000 | Freq: Every day | NASAL | 5 refills | Status: DC

## 2021-03-27 NOTE — Progress Notes (Signed)
NEW PATIENT  Date of Service/Encounter:  03/27/21  Consult requested by: Renee Rival, FNP   Assessment:   Chronic obstructive pulmonary disease  Seasonal and perennial allergic rhinitis   Rash - on bilateral arms  Disabled status  Plan/Recommendations:   1. Chronic obstructive pulmonary disease - Lung testing showed an FEV1 of 33%, but there was some minimal improvement with the albuterol treatment.  - We are not going to make any changes since Dr. Melvyn Novas seems to have everything under control. - We could consider adding on Xolair to help with your breathing (injection that neutralize allergy antibodies to help decrease breathing attacks and can improve lung function). - Information on Xolair provided.    2. Seasonal and perennial allergic rhinitis - Testing today showed: grasses, ragweed, trees, indoor molds, outdoor molds, dust mites, cat, dog, and cockroach. - Copy of test results provided.  - Avoidance measures provided. - Continue with: Xyzal (levocetirizine) 68m tablet once daily - Start taking: Flonase (fluticasone) two sprays per nostril daily (AIM FOR EAR ON EACH SIDE) and Astelin (azelastine) 2 sprays per nostril 1 time at night.  - You can use an extra dose of the antihistamine, if needed, for breakthrough symptoms.  - Consider nasal saline rinses 1-2 times daily to remove allergens from the nasal cavities as well as help with mucous clearance (this is especially helpful to do before the nasal sprays are given)  3. Return in about 2 months (around 05/25/2021).    This note in its entirety was forwarded to the Provider who requested this consultation.  Subjective:   Ethan RUSSETTis a 46y.o. male presenting today for evaluation of  Chief Complaint  Patient presents with   Allergic Rhinitis     States he has a runny nose, morning cough.     Ethan HISLOPhas a history of the following: Patient Active Problem List   Diagnosis Date Noted    Hypokalemia 01/31/2021   Chronic diarrhea of unknown origin 01/03/2021   Generalized abdominal pain 01/03/2021   Loss of appetite 01/03/2021   Elevated LFTs 11/13/2020   Leg swelling 11/07/2020   Cigarette smoker 04/18/2018   COPD with acute exacerbation [place] 09/22/2017   [place] use disorder [place] 01/11/2016   Essential hypertension 12/04/2015   Chest pain, non-cardiac 07/13/2015   COPD GOLD II 05/02/2015   VITAMIN B12 DEFICIENCY 10/13/2007   ERYTHROCYTOSIS 10/12/2007   CONFUSION 10/12/2007    History obtained from: chart review and patient.  Ethan Geroldwas referred by [name].     BTrystynis a 46y.o. male presenting for an evaluation of environmental allergies . He was referred for allergy testing and elevated IgE by Dr. WMelvyn Novas   Asthma/Respiratory Symptom History: He has seen Dr. WMelvyn Novasfor COPD for the past 7 years. He was a smoker and stopped in 2016 (he started when he was a teenager). He is on Symbicort two puffs BID and albuterol. This seems to control everything very well. He does report some coughing that is worse in the mornings.  He only needs prednisone once per year or so. It might have been two years since he had prednisone. He has never been on a biologic.  Allergic Rhinitis Symptom History: He does have rhinorrhea but this is not frequent. He does have intermittent sneezing. Most of his issues are likely from pollen and other spring triggers. Burning leaves in the fall messes him up.  September is  particularly bad for him. He never had allergies asa a child. He [place] in this area.   Skin Symptom History: He does report a rash on his bilateral arms. He sees Dermatology and he was told that he had too much exposure to the sun.  He is using Arnicare gel once weekly. He has never been told that he has eczema.   He has a macrocytic anemia. He has been followed for that by his PCP. He has had that problem relatively  recently. They have not done much in terms of a workup for this. This was just found on routine labs.   Otherwise, there is no history of other atopic diseases, including food allergies, drug allergies, stinging insect allergies, eczema, urticaria, or contact dermatitis. There is no significant infectious history. Vaccinations are up to date.    Past Medical History: Patient Active Problem List   Diagnosis Date Noted   Hypokalemia 01/31/2021   Chronic diarrhea of unknown origin 01/03/2021   Generalized abdominal pain 01/03/2021   Loss of appetite 01/03/2021   Elevated LFTs 11/13/2020   Leg swelling 11/07/2020   Cigarette smoker 04/18/2018   COPD with acute exacerbation ([place]) 09/22/2017   Alcohol intoxication delirium without use disorder ([place]) 01/11/2016   Essential hypertension 12/04/2015   Chest pain, non-cardiac 07/13/2015   COPD GOLD II 05/02/2015   VITAMIN B12 DEFICIENCY 10/13/2007   ERYTHROCYTOSIS 10/12/2007   CONFUSION 10/12/2007    Medication List:  Allergies as of 03/27/2021       Reactions   Epinephrine    Uncontrollable shaking with epinephrine in gums for dental work        Medication List        Accurate as of March 27, 2021  1:41 PM. If you have any questions, ask your nurse or doctor.          albuterol (2.5 MG/3ML) 0.083% nebulizer solution Commonly known as: PROVENTIL Take 3 mLs (2.5 mg total) by nebulization every 4 (four) hours as needed for wheezing or shortness of breath.   albuterol 108 (90 Base) MCG/ACT inhaler Commonly known as: VENTOLIN HFA INHALE 2 PUFFS INTO THE LUNGS EVERY 6 HOURS AS NEEDED FOR WHEEZING OR SHORTNESS OF BREATH   azelastine 0.1 % nasal spray Commonly known as: ASTELIN Place 2 sprays into both nostrils at bedtime. Use in each nostril as directed   budesonide-formoterol 160-4.5 MCG/ACT inhaler Commonly known as: SYMBICORT INHALE 2 PUFFS INTO THE LUNGS FIRST THING IN THE MORNING THEN INHALE 2 PUFFS BY MOUTH 12 HOURS  LATER   famotidine 20 MG tablet Commonly known as: PEPCID Take 20 mg by mouth 2 (two) times daily.   fluticasone 50 MCG/ACT nasal spray Commonly known as: FLONASE Place 2 sprays into both nostrils at bedtime.   Ibuprofen 200 MG Caps Take 200-400 mg by mouth every 8 (eight) hours as needed.   IMODIUM PO Take by mouth.   levocetirizine 5 MG tablet Commonly known as: XYZAL Take 1 tablet (5 mg total) by mouth 2 (two) times daily as needed. What changed: when to take this   multivitamin tablet Take 1 tablet by mouth every evening.   omeprazole 20 MG capsule Commonly known as: PRILOSEC Take 20 mg by mouth daily.   telmisartan 40 MG tablet Commonly known as: Micardis Take 1 tablet (40 mg total) by mouth daily.        Birth History: non-contributory  Developmental History: non-contributory  Past Surgical History: Past Surgical History:  Procedure Laterality Date  WISDOM TOOTH EXTRACTION       Family History: Family History  Problem Relation Age of Onset   Ovarian cancer Mother 84   Stomach cancer Mother 39   Diabetes Mother    Hypertension Mother    Huntington's disease Father        died at 2   Huntington's disease Brother    Colon cancer Maternal Grandmother    Heart attack Maternal Grandfather    Emphysema Paternal Grandmother    Rheum arthritis Paternal Grandmother    Lung cancer Paternal Grandmother    Heart attack Paternal Grandfather      Social History: Caidan lives at home with his family. He has three children ranging from 24 down to 10.  He lives in a house that is 47 years old.  There is wood flooring.  There is tile in the bedroom.  They have a heat pump as well as electric and gas heating.  There is a heat pump and central cooling.  There is 2 dogs and 1 cat in the home.  There are dust mite covers on the bed, but not the pillows.  There is no tobacco exposure.  He is currently on disability, but previously worked for the [employer].  He  had several different routes.  They do not use a HEPA filter in the home.  He is exposed to fumes, chemicals, and dust.   Review of Systems  Constitutional: Negative.  Negative for chills, fever, malaise/fatigue and weight loss.  HENT: Negative.  Negative for congestion, ear discharge and ear pain.   Eyes:  Negative for pain, discharge and redness.  Respiratory:  Positive for cough and shortness of breath. Negative for sputum production and wheezing.   Cardiovascular: Negative.  Negative for chest pain and palpitations.  Gastrointestinal:  Negative for abdominal pain, heartburn, nausea and vomiting.  Skin: Negative.  Negative for itching and rash.  Neurological:  Negative for dizziness and headaches.  Endo/Heme/Allergies:  Negative for environmental allergies. Does not bruise/bleed easily.      Objective:   Blood pressure (!) 142/84, pulse 99, temperature 98.7 F (37.1 C), temperature source Temporal, resp. rate 16, height 5' 6"  (1.676 m), weight 107 lb 14.4 oz (48.9 kg), SpO2 96 %. Body mass index is 17.42 kg/m.   Physical Exam:   Physical Exam Vitals reviewed.  Constitutional:      Appearance: He is underweight.  HENT:     Head: Normocephalic and atraumatic.     Right Ear: Tympanic membrane, ear canal and external ear normal. No drainage, swelling or tenderness. Tympanic membrane is not injected, scarred, erythematous, retracted or bulging.     Left Ear: Tympanic membrane, ear canal and external ear normal. No drainage, swelling or tenderness. Tympanic membrane is not injected, scarred, erythematous, retracted or bulging.     Nose: No nasal deformity, septal deviation, mucosal edema or rhinorrhea.     Right Turbinates: Enlarged, swollen and pale.     Left Turbinates: Enlarged, swollen and pale.     Right Sinus: No maxillary sinus tenderness or frontal sinus tenderness.     Left Sinus: No maxillary sinus tenderness or frontal sinus tenderness.     Mouth/Throat:     Mouth:  Mucous membranes are not pale and not dry.     Pharynx: Uvula midline.  Eyes:     General:        Right eye: No discharge.        Left eye: No discharge.  Conjunctiva/sclera: Conjunctivae normal.     Right eye: Right conjunctiva is not injected. No chemosis.    Left eye: Left conjunctiva is not injected. No chemosis.    Pupils: Pupils are equal, round, and reactive to light.  Cardiovascular:     Rate and Rhythm: Normal rate and regular rhythm.     Heart sounds: Normal heart sounds.  Pulmonary:     Effort: Pulmonary effort is normal. No tachypnea, accessory muscle usage or respiratory distress.     Breath sounds: Normal breath sounds. No wheezing, rhonchi or rales.     Comments: Moving air well in all lung fields. No increased work of breathing noted.  Chest:     Chest wall: No tenderness.  Abdominal:     Tenderness: There is no abdominal tenderness. There is no guarding or rebound.  Lymphadenopathy:     Head:     Right side of head: No submandibular, tonsillar or occipital adenopathy.     Left side of head: No submandibular, tonsillar or occipital adenopathy.     Cervical: No cervical adenopathy.  Skin:    General: Skin is warm.     Capillary Refill: Capillary refill takes less than 2 seconds.     Coloration: Skin is not pale.     Findings: No abrasion, erythema, petechiae or rash. Rash is not papular, urticarial or vesicular.     Comments: No eczematous or urticarial lesions noted. He does have some erythematous patches on his bilateral arms, which are improved per the patient.  Neurological:     Mental Status: He is alert.     Diagnostic studies:    Spirometry: results abnormal (FEV1: 1.15/33%, FVC: 2.34/53%, FEV1/FVC: 49%).    Spirometry consistent with mixed obstructive and restrictive disease. Xopenex four puffs via MDI treatment given in clinic with significant improvement in FEV1 and FVC per ATS criteria.  His FEV1 increased by 50% and his FVC increased by  14%.  Allergy Studies:     Airborne Adult Perc - 03/27/21 0945     Time Antigen Placed 0945    Allergen Manufacturer Lavella Hammock    Location Arm    Number of Test 59    1. Control-Buffer 50% Glycerol Negative    2. Control-Histamine 1 mg/ml 2+    3. Albumin saline Negative    4. Judsonia Negative    5. [place] Negative    6. Johnson Negative    7. Finney Blue Negative    8. Meadow Fescue Negative    9. Perennial Rye Negative    10. Sweet Vernal Negative    11. Timothy Negative    12. Cocklebur Negative    13. Burweed Marshelder Negative    14. Ragweed, short Negative    15. Ragweed, Giant Negative    16. Plantain,  English Negative    17. Lamb's Quarters Negative    18. Sheep Sorrell Negative    19. Rough Pigweed Negative    20. Marsh Elder, Rough Negative    21. Mugwort, Common Negative    22. Ash mix Negative    23. Birch mix Negative    24. Beech American Negative    25. Box, Elder Negative    26. Cedar, red Negative    27. Cottonwood, [place] Negative    28. Elm mix Negative    29. Hickory 2+    30. Maple mix Negative    31. Oak, [place] mix 2+    32. Pecan Pollen Negative    33. [employer]  mix Negative    34. Sycamore Eastern Negative    35. Bowie, Black Pollen Negative    36. Alternaria alternata Negative    37. Cladosporium Herbarum Negative    38. Aspergillus mix Negative    39. Penicillium mix Negative    40. Bipolaris sorokiniana (Helminthosporium) Negative    41. Drechslera spicifera (Curvularia) Negative    42. Mucor plumbeus Negative    43. Fusarium moniliforme Negative    44. Aureobasidium pullulans (pullulara) Negative    45. Rhizopus oryzae Negative    46. Botrytis cinera Negative    47. Epicoccum nigrum Negative    48. Phoma betae Negative    49. Candida Albicans Negative    50. Trichophyton mentagrophytes Negative    51. Mite, D Farinae  5,000 AU/ml 3+    52. Mite, D Pteronyssinus  5,000 AU/ml 3+    53. Cat Hair 10,000 BAU/ml Negative    54.  Dog  Epithelia Negative    55. Mixed Feathers Negative    56. Horse Epithelia Negative    57. Cockroach, German Negative    58. Mouse Negative    59. Tobacco Leaf Negative             Intradermal - 03/27/21 1031     Time Antigen Placed 1031    Allergen Manufacturer Lavella Hammock    Location Arm    Number of Test 13    Intradermal Select    Control Negative    Gu[place] 2+    Johnson Negative    7 Grass Negative    Ragweed mix 2+    Weed mix Negative    Mold 1 Negative    Mold 2 2+    Mold 3 2+    Mold 4 3+    Cat 3+    Dog 2+    Cockroach 2+             Allergy testing results were read and interpreted by myself, documented by clinical staff.         Salvatore Marvel, MD [facility] [place] [facility] [place]

## 2021-03-27 NOTE — Patient Instructions (Addendum)
1. Chronic obstructive pulmonary disease - Lung testing showed an FEV1 of 33%, but there was some minimal improvement with the albuterol treatment.  - We are not going to make any changes since Dr. Melvyn Novas seems to have everything under control. - We could consider adding on Xolair to help with your breathing (injection that neutralize allergy antibodies to help decrease breathing attacks and can improve lung function). - Information on Xolair provided.    2. Seasonal and perennial allergic rhinitis - Testing today showed: grasses, ragweed, trees, indoor molds, outdoor molds, dust mites, cat, dog, and cockroach. - Copy of test results provided.  - Avoidance measures provided. - Continue with: Xyzal (levocetirizine) 46m tablet once daily - Start taking: Flonase (fluticasone) two sprays per nostril daily (AIM FOR EAR ON EACH SIDE) and Astelin (azelastine) 2 sprays per nostril 1 time at night.  - You can use an extra dose of the antihistamine, if needed, for breakthrough symptoms.  - Consider nasal saline rinses 1-2 times daily to remove allergens from the nasal cavities as well as help with mucous clearance (this is especially helpful to do before the nasal sprays are given)  3. Return in about 2 months (around 05/25/2021).    Please inform [place] any Emergency Department visits, hospitalizations, or changes in symptoms. Call [place]efore going to the ED for breathing or allergy symptoms since we might be able to fit you in for a sick visit. Feel free to contact u[place]anytime with any questions, problems, or concerns.  It was a pleasure to meet you today!  Websites that have reliable patient information: 1. American Academy of Asthma, Allergy, and Immunology: www.aaaai.org 2. Food Allergy Research and Education (FARE): foodallergy.org 3. Mothers of Asthmatics: http://www.asthmacommunitynetwork.org 4. American College of Allergy, Asthma, and Immunology: www.acaai.org   COVID-19 Vaccine Information can  be found at: [email or web address] questions related to vaccine distribution or appointments, please email [email or web address] or call [phone]   We realize that you might be concerned about having an allergic reaction to the COVID19 vaccines. To help with that concern, WE ARE OFFERING THE COVID19 VACCINES IN OUR OFFICE! Ask the front desk for dates!     Like u[place]on F[place]and Instagram for our latest updates!      A healthy democracy works best when ANew [employer] Life Insuranceparticipate! Make sure you are registered to vote! If you have moved or changed any of your contact information, you will need to get this updated before voting!  In some cases, you MAY be able to register to vote online: [email or web address]    Airborne Adult Perc - 03/27/21 0945     Time Antigen Placed 0945    Allergen Manufacturer GLavella Hammock   Location Arm    Number of Test 59    1. Control-Buffer 50% Glycerol Negative    2. Control-Histamine 1 mg/ml 2+    3. Albumin saline Negative    4. BGoodhueNegative    5. BGuatemalaNegative    6. Johnson Negative    7. KVernon CenterBlue Negative    8. Meadow Fescue Negative    9. Perennial Rye Negative    10. Sweet Vernal Negative    11. Timothy Negative    12. Cocklebur Negative    13. Burweed Marshelder Negative    14. Ragweed, short Negative    15. Ragweed, Giant Negative    16. Plantain,  English Negative    17. Lamb's Quarters Negative    18. Sheep Sorrell Negative  19. Rough Pigweed Negative    20. Marsh Elder, Rough Negative    21. Mugwort, Common Negative    22. Ash mix Negative    23. Birch mix Negative    24. Beech American Negative    25. Box, Elder Negative    26. Cedar, red Negative    27. Cottonwood, [place] Negative    28. Elm mix Negative    29. Hickory 2+    30. Maple mix Negative    31. Oak, [place] mix 2+    32. Pecan Pollen Negative    33. Pine mix  Negative    34. Sycamore Eastern Negative    35. [place], Black Pollen Negative    36. Alternaria alternata Negative    37. Cladosporium Herbarum Negative    38. Aspergillus mix Negative    39. Penicillium mix Negative    40. Bipolaris sorokiniana (Helminthosporium) Negative    41. Drechslera spicifera (Curvularia) Negative    42. Mucor plumbeus Negative    43. Fusarium moniliforme Negative    44. Aureobasidium pullulans (pullulara) Negative    45. Rhizopus oryzae Negative    46. Botrytis cinera Negative    47. Epicoccum nigrum Negative    48. Phoma betae Negative    49. Candida Albicans Negative    50. Trichophyton mentagrophytes Negative    51. Mite, D Farinae  5,000 AU/ml 3+    52. Mite, D Pteronyssinus  5,000 AU/ml 3+    53. Cat Hair 10,000 BAU/ml Negative    54.  Dog Epithelia Negative    55. Mixed Feathers Negative    56. Horse Epithelia Negative    57. Cockroach, German Negative    58. Mouse Negative    59. Tobacco Leaf Negative             Intradermal - 03/27/21 1031     Time Antigen Placed 1031    Allergen Manufacturer Lavella Hammock    Location Arm    Number of Test 13    Intradermal Select    Control Negative    [place] 2+    Johnson Negative    7 Grass Negative    Ragweed mix 2+    Weed mix Negative    Mold 1 Negative    Mold 2 2+    Mold 3 2+    Mold 4 3+    Cat 3+    Dog 2+    Cockroach 2+             Reducing Pollen Exposure  The American Academy of Allergy, Asthma and Immunology suggests the following steps to reduce your exposure to pollen during allergy seasons.    Do not hang sheets or clothing out to dry; pollen may collect on these items. Do not mow lawns or spend time around freshly cut grass; mowing stirs up pollen. Keep windows closed at night.  Keep car windows closed while driving. Minimize morning activities outdoors, a time when pollen counts are usually at their highest. Stay indoors as much as possible when pollen counts or  humidity is high and on windy days when pollen tends to remain in the air longer. Use air conditioning when possible.  Many air conditioners have filters that trap the pollen spores. Use a HEPA room air filter to remove pollen form the indoor air you breathe.  Control of Mold Allergen   Mold and fungi can grow on a variety of surfaces provided certain temperature and moisture conditions exist.  Outdoor molds  grow on plants, decaying vegetation and soil.  The major outdoor mold, Alternaria and Cladosporium, are found in very high numbers during hot and dry conditions.  Generally, a late Summer - Fall peak is seen for common outdoor fungal spores.  Rain will temporarily lower outdoor mold spore count, but counts rise rapidly when the rainy period ends.  The most important indoor molds are Aspergillus and Penicillium.  Dark, humid and poorly ventilated basements are ideal sites for mold growth.  The next most common sites of mold growth are the bathroom and the kitchen.  Outdoor (Seasonal) Mold Control  Positive outdoor molds via skin testing: Bipolaris (Helminthsporium), Drechslera (Curvalaria), and Mucor  Use air conditioning and keep windows closed Avoid exposure to decaying vegetation. Avoid leaf raking. Avoid grain handling. Consider wearing a face mask if working in moldy areas.    Indoor (Perennial) Mold Control   Positive indoor molds via skin testing: Aspergillus, Penicillium, Fusarium, Aureobasidium (Pullulara), and Rhizopus  Maintain humidity below 50%. Clean washable surfaces with 5% bleach solution. Remove sources e.g. contaminated carpets.    Control of Dust Mite Allergen    Dust mites play a major role in allergic asthma and rhinitis.  They occur in environments with high humidity wherever human skin is found.  Dust mites absorb humidity from the atmosphere (ie, they do not drink) and feed on organic matter (including shed human and animal skin).  Dust mites are a  microscopic type of insect that you cannot see with the naked eye.  High levels of dust mites have been detected from mattresses, pillows, carpets, upholstered furniture, bed covers, clothes, soft toys and any woven material.  The principal allergen of the dust mite is found in its feces.  A gram of dust may contain 1,000 mites and 250,000 fecal particles.  Mite antigen is easily measured in the air during house cleaning activities.  Dust mites do not bite and do not cause harm to humans, other than by triggering allergies/asthma.    Ways to decrease your exposure to dust mites in your home:  Encase mattresses, box springs and pillows with a mite-impermeable barrier or cover   Wash sheets, blankets and drapes weekly in hot water (130 F) with detergent and dry them in a dryer on the hot setting.  Have the room cleaned frequently with a vacuum cleaner and a damp dust-mop.  For carpeting or rugs, vacuuming with a vacuum cleaner equipped with a high-efficiency particulate air (HEPA) filter.  The dust mite allergic individual should not be in a room which is being cleaned and should wait 1 hour after cleaning before going into the room. Do not sleep on upholstered furniture (eg, couches).   If possible removing carpeting, upholstered furniture and drapery from the home is ideal.  Horizontal blinds should be eliminated in the rooms where the person spends the most time (bedroom, study, television room).  Washable vinyl, roller-type shades are optimal. Remove all non-washable stuffed toys from the bedroom.  Wash stuffed toys weekly like sheets and blankets above.   Reduce indoor humidity to less than 50%.  Inexpensive humidity monitors can be purchased at most hardware stores.  Do not use a humidifier as can make the problem worse and are not recommended.  Control of Dog or Cat Allergen  Avoidance is the best way to manage a dog or cat allergy. If you have a dog or cat and are allergic to dog or cats,  consider removing the dog or cat from the  home. If you have a dog or cat but dont want to find it a new home, or if your family wants a pet even though someone in the household is allergic, here are some strategies that may help keep symptoms at bay:  Keep the pet out of your bedroom and restrict it to only a few rooms. Be advised that keeping the dog or cat in only one room will not limit the allergens to that room. Dont pet, hug or kiss the dog or cat; if you do, wash your hands with soap and water. High-efficiency particulate air (HEPA) cleaners run continuously in a bedroom or living room can reduce allergen levels over time. Regular use of a high-efficiency vacuum cleaner or a central vacuum can reduce allergen levels. Giving your dog or cat a bath at least once a week can reduce airborne allergen.  Control of Cockroach Allergen  Cockroach allergen has been identified as an important cause of acute attacks of asthma, especially in urban settings.  There are fifty-five species of cockroach that exist in the [place], however only three, the [place], [place] species produce allergen that can affect patients with Asthma.  Allergens can be obtained from fecal particles, egg casings and secretions from cockroaches.    Remove food sources. Reduce access to water. Seal access and entry points. Spray runways with 0.5-1% Diazinon or Chlorpyrifos Blow boric acid power under stoves and refrigerator. Place bait stations (hydramethylnon) at feeding sites.  Allergy Shots   Allergies are the result of a chain reaction that starts in the immune system. Your immune system controls how your body defends itself. For instance, if you have an allergy to pollen, your immune system identifies pollen as an invader or allergen. Your immune system overreacts by producing antibodies called Immunoglobulin E (IgE). These antibodies travel to cells that release chemicals, causing an allergic  reaction.  The concept behind allergy immunotherapy, whether it is received in the form of shots or tablets, is that the immune system can be desensitized to specific allergens that trigger allergy symptoms. Although it requires time and patience, the payback can be long-term relief.  How Do Allergy Shots Work?  Allergy shots work much like a vaccine. Your body responds to injected amounts of a particular allergen given in increasing doses, eventually developing a resistance and tolerance to it. Allergy shots can lead to decreased, minimal or no allergy symptoms.  There generally are two phases: build-up and maintenance. Build-up often ranges from three to six months and involves receiving injections with increasing amounts of the allergens. The shots are typically given once or twice a week, though more rapid build-up schedules are sometimes used.  The maintenance phase begins when the most effective dose is reached. This dose is different for each person, depending on how allergic you are and your response to the build-up injections. Once the maintenance dose is reached, there are longer periods between injections, typically two to four weeks.  Occasionally doctors give cortisone-type shots that can temporarily reduce allergy symptoms. These types of shots are different and should not be confused with allergy immunotherapy shots.  Who Can Be Treated with Allergy Shots?  Allergy shots may be a good treatment approach for people with allergic rhinitis (hay fever), allergic asthma, conjunctivitis (eye allergy) or stinging insect allergy.   Before deciding to begin allergy shots, you should consider:   The length of allergy season and the severity of your symptoms  Whether medications and/or changes to your  environment can control your symptoms  Your desire to avoid long-term medication use  Time: allergy immunotherapy requires a major time commitment  Cost: may vary depending on your insurance  coverage  Allergy shots for children age 64 and older are effective and often well tolerated. They might prevent the onset of new allergen sensitivities or the progression to asthma.  Allergy shots are not started on patients who are pregnant but can be continued on patients who become pregnant while receiving them. In some patients with other medical conditions or who take certain common medications, allergy shots may be of risk. It is important to mention other medications you talk to your allergist.   When Will I Feel Better?  Some may experience decreased allergy symptoms during the build-up phase. For others, it may take as long as 12 months on the maintenance dose. If there is no improvement after a year of maintenance, your allergist will discuss other treatment options with you.  If you arent responding to allergy shots, it may be because there is not enough dose of the allergen in your vaccine or there are missing allergens that were not identified during your allergy testing. Other reasons could be that there are high levels of the allergen in your environment or major exposure to non-allergic triggers like tobacco smoke.  What Is the Length of Treatment?  Once the maintenance dose is reached, allergy shots are generally continued for three to five years. The decision to stop should be discussed with your allergist at that time. Some people may experience a permanent reduction of allergy symptoms. Others may relapse and a longer course of allergy shots can be considered.  What Are the Possible Reactions?  The two types of adverse reactions that can occur with allergy shots are local and systemic. Common local reactions include very mild redness and swelling at the injection site, which can happen immediately or several hours after. A systemic reaction, which is less common, affects the entire body or a particular body system. They are usually mild and typically respond quickly to  medications. Signs include increased allergy symptoms such as sneezing, a stuffy nose or hives.  Rarely, a serious systemic reaction called anaphylaxis can develop. Symptoms include swelling in the throat, wheezing, a feeling of tightness in the chest, nausea or dizziness. Most serious systemic reactions develop within 30 minutes of allergy shots. This is why it is strongly recommended you wait in your doctors office for 30 minutes after your injections. Your allergist is trained to watch for reactions, and his or her staff is trained and equipped with the proper medications to identify and treat them.  Who Should Administer Allergy Shots?  The preferred location for receiving shots is your prescribing allergists office. Injections can sometimes be given at another facility where the physician and staff are trained to recognize and treat reactions, and have received instructions by your prescribing allergist.

## 2021-04-03 ENCOUNTER — Other Ambulatory Visit: Payer: Self-pay

## 2021-04-03 ENCOUNTER — Ambulatory Visit: Payer: Federal, State, Local not specified - PPO | Admitting: Gastroenterology

## 2021-04-03 ENCOUNTER — Encounter: Payer: Self-pay | Admitting: Gastroenterology

## 2021-04-03 VITALS — BP 152/93 | HR 98 | Temp 97.7°F | Ht 66.0 in | Wt 112.0 lb

## 2021-04-03 DIAGNOSIS — R7989 Other specified abnormal findings of blood chemistry: Secondary | ICD-10-CM

## 2021-04-03 NOTE — Progress Notes (Signed)
Primary Care Physician:  Renee Rival, FNP Primary Gastroenterologist:  Dr.   Laurel Dimmer Complaint  Patient presents with   elevated LFT    HPI:   Ethan Norton is a 46 y.o. male presenting today at the request of Dr. Christinia Gully with Pulmonology due to elevated LFTs.   Prior labs reviewed with first set of LFTs in Sept 2022. Tbili 1.3, Alk Phos 131, AST 110, ALT 52. November 2022: Alk Phos 125, AST 112, ALT 52, similar. Last HFP in Dec 2022 with Tbili 1.3, Alk Phos normal at 110, AST 106, ALT normal at 39.  Was very sick with GI virus in early November. Had N/V/D.   Hep C antibody negative. Hep B surface antibody positive consistent with immunmity. Hep B surface antigen negative. US abdomen with fatty liver in Nov 2022.    History of drinking alcohol after work. Few beers after work. Now just every now and then.   No new meds in September that he was aware. No OTC herbal supplements. No family history of liver disease. Occasionally will have LUQ pain that radiates across his abdomen to his right side. Right flank pain daily, achy. Worsens about twice per week. Present for about 3 years. Unsure if associated with eating.   Eats 3-4 bites and then feels full. Eats 10 times per day but very small meals. Has early satiety. Present for about 2-3 years. Takes Ibuprofen 200 mg per day. Will take 5-6 per day if bad pain. No nausea. No dysphagia. No melena or hematochezia.   No prior colonoscopy.   Omeprazole once per day.     Past Medical History:  Diagnosis Date   Anxiety    COPD (chronic obstructive pulmonary disease) ([place])    Depression    Hypertension    Neuropathic pain     Past Surgical History:  Procedure Laterality Date   WISDOM TOOTH EXTRACTION      Current Outpatient Medications  Medication Sig Dispense Refill   albuterol (PROVENTIL) (2.5 MG/3ML) 0.083% nebulizer solution Take 3 mLs (2.5 mg total) by nebulization every 4 (four) hours as needed for  wheezing or shortness of breath.     albuterol (VENTOLIN HFA) 108 (90 Base) MCG/ACT inhaler INHALE 2 PUFFS INTO THE LUNGS EVERY 6 HOURS AS NEEDED FOR WHEEZING OR SHORTNESS OF BREATH 8.5 g 0   azelastine (ASTELIN) 0.1 % nasal spray Place 2 sprays into both nostrils at bedtime. Use in each nostril as directed 30 mL 5   budesonide-formoterol (SYMBICORT) 160-4.5 MCG/ACT inhaler INHALE 2 PUFFS INTO THE LUNGS FIRST THING IN THE MORNING THEN INHALE 2 PUFFS BY MOUTH 12 HOURS LATER 10.2 g 11   fluticasone (FLONASE) 50 MCG/ACT nasal spray Place 2 sprays into both nostrils at bedtime. 16 g 5   Ibuprofen 200 MG CAPS Take 200-400 mg by mouth every 8 (eight) hours as needed.     levocetirizine (XYZAL) 5 MG tablet Take 1 tablet (5 mg total) by mouth 2 (two) times daily as needed. 60 tablet 5   Loperamide HCl (IMODIUM PO) Take by mouth. As needed     Multiple Vitamin (MULTIVITAMIN) tablet Take 1 tablet by mouth every evening.      omeprazole (PRILOSEC) 20 MG capsule Take 20 mg by mouth daily.     telmisartan (MICARDIS) 40 MG tablet Take 1 tablet (40 mg total) by mouth daily. 30 tablet 11   famotidine (PEPCID) 20 MG tablet Take 20 mg by mouth daily. (Patient  not taking: Reported on 04/03/2021)     Current Facility-Administered Medications  Medication Dose Route Frequency Provider Last Rate Last Admin   feeding supplement (ENSURE ENLIVE / ENSURE PLUS) liquid 237 mL  237 mL Oral BID BM Paseda, Folashade R, FNP        Allergies as of 04/03/2021 - Review Complete 04/03/2021  Allergen Reaction Noted   Epinephrine  10/12/2007    Family History  Problem Relation Age of Onset   Ovarian cancer Mother 24   Stomach cancer Mother 67   Diabetes Mother    Hypertension Mother    Huntington's disease Father        died at 68   Huntington's disease Brother    Colon cancer Maternal Grandmother    Heart attack Maternal Grandfather    Emphysema Paternal Grandmother    Rheum arthritis Paternal Grandmother    Lung  cancer Paternal Grandmother    Heart attack Paternal Grandfather     Social History   Socioeconomic History   Marital status: Married    Spouse name: Not on file   Number of children: 3   Years of education: Not on file   Highest education level: Some college, no degree  Occupational History   Not on file  Tobacco Use   Smoking status: Former    Packs/day: 1.50    Years: 21.00    Pack years: 31.50    Types: Cigarettes    Quit date: 02/17/2014    Years since quitting: 7.1   Smokeless tobacco: Never  Vaping Use   Vaping Use: Never used  Substance and Sexual Activity   Alcohol use: Not Currently    Alcohol/week: 2.0 standard drinks    Types: 2 Glasses of wine per week    Comment: occassional 2 glasses of wine a week   Drug use: No   Sexual activity: Yes    Comment: has one sexual patner  Other Topics Concern   Not on file  Social History Narrative   Not on file   Social Determinants of Health   Financial Resource Strain: Not on file  Food Insecurity: No Food Insecurity   Worried About Charity fundraiser in the Last Year: Never true   Ran Out of Food in the Last Year: Never true  Transportation Needs: Not on file  Physical Activity: Not on file  Stress: Not on file  Social Connections: Not on file  Intimate Partner Violence: Not on file    Review of Systems: Gen: Denies any fever, chills, fatigue, weight loss, lack of appetite.  CV: Denies chest pain, heart palpitations, peripheral edema, syncope.  Resp: Denies shortness of breath at rest or with exertion. Denies wheezing or cough.  GI: see HPI GU : Denies urinary burning, urinary frequency, urinary hesitancy MS: Denies joint pain, muscle weakness, cramps, or limitation of movement.  Derm: Denies rash, itching, dry skin Psych: Denies depression, anxiety, memory loss, and confusion Heme: Denies bruising, bleeding, and enlarged lymph nodes.  Physical Exam: BP (!) 152/93    Pulse 98    Temp 97.7 F (36.5 C)     Ht _0  (1.676 m)    Wt 112 lb (50.8 kg)    BMI 18.08 kg/m  General:   Alert and oriented. Pleasant and cooperative. Thin Head:  Normocephalic and atraumatic. Eyes:  Without icterus, sclera clear and conjunctiva pink.  Ears:  Normal auditory acuity. Mouth:  mask in place Lungs:  expiratory wheeze bilaterally Heart:  S1, S2  present without murmurs appreciated.  Abdomen:  +BS, soft, non-tender and non-distended. No HSM noted. No guarding or rebound. No masses appreciated.  Rectal:  Deferred  Msk:  Symmetrical without gross deformities. Normal posture. Extremities:  Without edema. Neurologic:  Alert and  oriented x4;  grossly normal neurologically. Skin:  Intact without significant lesions or rashes. Psych:  Alert and cooperative. Normal mood and affect.  ASSESSMENT: Ethan Norton is a 46 y.o. male presenting today at the request of Dr. Christinia Gully with Pulmonology due to elevated LFTs noted in Sept 2022.  Elevated LFTs: with pattern of AST more elevated than ALT, Tbili slightly increased but non-specific. History of ETOH use in the past but now just occasionally. Known fatty liver. Hep C antibody negative. Hep B surface antibody positive consistent with immunmity. Hep B surface antigen negative. Notably, he did have an acute viral GI illness in November. Could have had elevated LFTs in setting of acute illness with known fatty liver. However, we will update HFP now and add iron studies. If HFP remains elevated, recommend thorough serologies.   Early satiety/dyspepsia: present for several years. Endorses Ibuprofen 200 mg per day and occasionally 5-6. Discussed limiting this if at all possible. Currently on omeprazole once daily. Recommend EGD, but patient would like to hold off on this currently.  Recommend initial screening colonoscopy as well, be he is deferring this currently.    PLAN: Update HFP, check iron studies Will need further serologies if remains elevated Limit NSAID  use Colonoscopy/EGD when willing Return in 3 months   Annitta Needs, PhD, [facility]astroenter[facility]

## 2021-04-03 NOTE — Patient Instructions (Signed)
Please have blood work done when you are able.  I would limit the Ibuprofen use if possible.  I recommend a colonoscopy and upper endoscopy when you are able.  We will see you in 3 months!  It was a pleasure to see you today. I want to create trusting relationships with patients to provide genuine, compassionate, and quality care. I value your feedback. If you receive a survey regarding your visit,  I greatly appreciate you taking time to fill this out.   Annitta Needs, PhD, ANP-BC [facility]

## 2021-04-04 ENCOUNTER — Other Ambulatory Visit (HOSPITAL_COMMUNITY)
Admission: RE | Admit: 2021-04-04 | Discharge: 2021-04-04 | Disposition: A | Discharge status: Home / Self Care | Payer: Federal, State, Local not specified - PPO | Source: Ambulatory Visit | Attending: Gastroenterology | Admitting: Gastroenterology

## 2021-04-04 DIAGNOSIS — R7989 Other specified abnormal findings of blood chemistry: Secondary | ICD-10-CM | POA: Insufficient documentation

## 2021-04-04 LAB — HEPATIC FUNCTION PANEL
ALT: 52 U/L — ABNORMAL HIGH (ref 0–44)
AST: 129 U/L — ABNORMAL HIGH (ref 15–41)
Albumin: 3.1 g/dL — ABNORMAL LOW (ref 3.5–5.0)
Alkaline Phosphatase: 113 U/L (ref 38–126)
Bilirubin, Direct: 0.5 mg/dL — ABNORMAL HIGH (ref 0.0–0.2)
Indirect Bilirubin: 0.9 mg/dL (ref 0.3–0.9)
Total Bilirubin: 1.4 mg/dL — ABNORMAL HIGH (ref 0.3–1.2)
Total Protein: 6 g/dL — ABNORMAL LOW (ref 6.5–8.1)

## 2021-04-04 LAB — IRON AND TIBC
Iron: 289 ug/dL — ABNORMAL HIGH (ref 45–182)
Saturation Ratios: 85 % — ABNORMAL HIGH (ref 17.9–39.5)
TIBC: 340 ug/dL (ref 250–450)
UIBC: 51 ug/dL

## 2021-04-04 LAB — FERRITIN: Ferritin: 1351 ng/mL — ABNORMAL HIGH (ref 24–336)

## 2021-04-05 ENCOUNTER — Encounter: Payer: Self-pay | Admitting: Nurse Practitioner

## 2021-04-08 NOTE — Telephone Encounter (Signed)
PT is returning a call 

## 2021-04-08 NOTE — Telephone Encounter (Signed)
lmtrc

## 2021-04-08 NOTE — Telephone Encounter (Signed)
Spoke with pt advised that Ethan Norton states that he can take ibuprofen but stop if he notices rectal bleeding. Pt states that he takes aleve once a day and will continue to take it since he states that other providers have told him not to take it. Please advise

## 2021-04-09 NOTE — Telephone Encounter (Signed)
Spoke with pt advised of Fola's message pt verbalized understanding

## 2021-04-16 ENCOUNTER — Other Ambulatory Visit: Payer: Self-pay

## 2021-04-16 ENCOUNTER — Telehealth: Payer: Self-pay | Admitting: Gastroenterology

## 2021-04-16 DIAGNOSIS — R7989 Other specified abnormal findings of blood chemistry: Secondary | ICD-10-CM

## 2021-04-16 NOTE — Telephone Encounter (Signed)
Lmom for pt to return my call.  

## 2021-04-16 NOTE — Telephone Encounter (Signed)
Pt wants to pursue the more extensive labs that you previously wanted him to have.

## 2021-04-16 NOTE — Addendum Note (Signed)
Addended by: Annitta Needs on: 04/16/2021 02:24 PM   Modules accepted: Orders

## 2021-04-16 NOTE — Telephone Encounter (Signed)
[phone] ANNA HAD WANTED PATIENT TO HAVE LABS DONE AND HE SAID THAT HE CAN GO TOMORROW AND HAVE THEM DONE AT [facility].

## 2021-04-16 NOTE — Telephone Encounter (Signed)
I placed orders for Hemochromatosis DNA PCR, ASMA, AMA, ANA, immunoglobulins, ceruloplasmin. He can  have this done at [facility]. Thanks! Just need to release them.

## 2021-04-16 NOTE — Telephone Encounter (Signed)
Redone! Thanks!

## 2021-04-16 NOTE — Telephone Encounter (Signed)
The ANA direct will need to be redone, it came over through [facility]

## 2021-04-16 NOTE — Telephone Encounter (Signed)
Pt was made aware.  

## 2021-04-16 NOTE — Addendum Note (Signed)
Addended by: Annitta Needs on: 04/16/2021 01:48 PM   Modules accepted: Orders

## 2021-04-17 ENCOUNTER — Other Ambulatory Visit (HOSPITAL_COMMUNITY)
Admission: RE | Admit: 2021-04-17 | Discharge: 2021-04-17 | Disposition: A | Discharge status: Home / Self Care | Payer: Federal, State, Local not specified - PPO | Source: Ambulatory Visit | Attending: Gastroenterology | Admitting: Gastroenterology

## 2021-04-17 DIAGNOSIS — R7989 Other specified abnormal findings of blood chemistry: Secondary | ICD-10-CM | POA: Insufficient documentation

## 2021-04-17 NOTE — Telephone Encounter (Signed)
Handled by Tammy ?

## 2021-04-18 LAB — IGA: IgA: 448 mg/dL — ABNORMAL HIGH (ref 90–386)

## 2021-04-18 LAB — IGG: IgG (Immunoglobin G), Serum: 1009 mg/dL (ref 603–1613)

## 2021-04-18 LAB — CERULOPLASMIN: Ceruloplasmin: 17.1 mg/dL (ref 16.0–31.0)

## 2021-04-18 LAB — ANTI-SMOOTH MUSCLE ANTIBODY, IGG: F-Actin IgG: 5 Units (ref 0–19)

## 2021-04-18 LAB — MITOCHONDRIAL ANTIBODIES: Mitochondrial M2 Ab, IgG: <20 Units (ref 0.0–20.0)

## 2021-04-18 LAB — IGM: IgM (Immunoglobulin M), Srm: 176 mg/dL — ABNORMAL HIGH (ref 20–172)

## 2021-04-18 LAB — ANA: Anti Nuclear Antibody (ANA): NEGATIVE

## 2021-04-23 LAB — MISC LABCORP TEST (SEND OUT): Labcorp test code: 164947

## 2021-04-23 LAB — HEMOCHROMATOSIS DNA-PCR(C282Y,H63D)

## 2021-05-01 ENCOUNTER — Other Ambulatory Visit: Payer: Self-pay

## 2021-05-01 DIAGNOSIS — F10921 Alcohol use, unspecified with intoxication delirium: Secondary | ICD-10-CM

## 2021-05-01 DIAGNOSIS — R7989 Other specified abnormal findings of blood chemistry: Secondary | ICD-10-CM

## 2021-05-02 ENCOUNTER — Other Ambulatory Visit: Payer: Self-pay | Admitting: Gastroenterology

## 2021-05-02 ENCOUNTER — Other Ambulatory Visit: Payer: Self-pay

## 2021-05-02 NOTE — Progress Notes (Signed)
Phoned and advised the pt of the place where he is to go for his 24 hr urine . Pt expressed understanding of this  ?

## 2021-05-02 NOTE — Progress Notes (Signed)
24 hour urine copper orders for [facility] placed. Just needs to be released. Thanks! Please have him go to [facility] and not [facility].  ?

## 2021-05-07 DIAGNOSIS — R7989 Other specified abnormal findings of blood chemistry: Secondary | ICD-10-CM | POA: Diagnosis not present

## 2021-05-08 LAB — COPPER, URINE, 24 HOUR
Copper,Urine (24 Hr): 6 mcg/24 h — ABNORMAL LOW (ref 15–60)
Total Volume: 1000 mL

## 2021-05-14 ENCOUNTER — Other Ambulatory Visit: Payer: Self-pay

## 2021-05-14 DIAGNOSIS — R7989 Other specified abnormal findings of blood chemistry: Secondary | ICD-10-CM

## 2021-05-14 DIAGNOSIS — F10921 Alcohol use, unspecified with intoxication delirium: Secondary | ICD-10-CM

## 2021-05-16 ENCOUNTER — Other Ambulatory Visit: Payer: Self-pay | Admitting: Internal Medicine

## 2021-05-31 ENCOUNTER — Ambulatory Visit: Payer: Federal, State, Local not specified - PPO | Admitting: Allergy & Immunology

## 2021-05-31 ENCOUNTER — Encounter: Payer: Self-pay | Admitting: Allergy & Immunology

## 2021-05-31 VITALS — BP 120/82 | HR 105 | Resp 16

## 2021-05-31 DIAGNOSIS — J302 Other seasonal allergic rhinitis: Secondary | ICD-10-CM

## 2021-05-31 DIAGNOSIS — J3089 Other allergic rhinitis: Secondary | ICD-10-CM

## 2021-05-31 DIAGNOSIS — J449 Chronic obstructive pulmonary disease, unspecified: Secondary | ICD-10-CM

## 2021-05-31 MED ORDER — LEVOCETIRIZINE DIHYDROCHLORIDE 5 MG PO TABS: 5.0000 mg | ORAL_TABLET | Freq: Two times a day (BID) | ORAL | 5 refills | Status: AC | PRN

## 2021-05-31 MED ORDER — FLUTICASONE PROPIONATE 50 MCG/ACT NA SUSP: 2.0000 | Freq: Every day | NASAL | 5 refills | Status: AC

## 2021-05-31 NOTE — Patient Instructions (Addendum)
1. Chronic obstructive pulmonary disease ?- Lung testing showed an FEV1 of 33%, but there was some minimal improvement with the albuterol treatment.  ?- We are not going to make any changes since Dr. Melvyn Novas seems to have everything under control. ?- Consider adding on Xolair to help with your breathing (injection that neutralize allergy antibodies to help decrease breathing attacks and can improve lung function). ?- We will work on getting a follow up appointment with Dr. Melvyn Novas.  ? ?2. Seasonal and perennial allergic rhinitis (grasses, ragweed, trees, indoor molds, outdoor molds, dust mites, cat, dog, and cockroach) ?- Consider allergy shots for long term control. ?- CPT codes provided, so check with your insurance company regarding coverage if you want to pursue this.  ?- Continue with: Xyzal (levocetirizine) 6m tablet once daily ?- Continue with: Flonase (fluticasone) two sprays per nostril daily (AIM FOR EAR ON EACH SIDE) and Astelin (azelastine) 2 sprays per nostril 1 time at night.  ?- You can use an extra dose of the antihistamine, if needed, for breakthrough symptoms.  ?- Consider nasal saline rinses 1-2 times daily to remove allergens from the nasal cavities as well as help with mucous clearance (this is especially helpful to do before the nasal sprays are given) ? ?3. Return in about 6 months (around 11/30/2021).  ? ? ?Please inform [place] any Emergency Department visits, hospitalizations, or changes in symptoms. Call u[place]efore going to the ED for breathing or allergy symptoms since we might be able to fit you in for a sick visit. Feel free to contact u[place]anytime with any questions, problems, or concerns. ? ?It was a pleasure to meet you today! ? ?Websites that have reliable patient information: ?1. American Academy of Asthma, Allergy, and Immunology: www.aaaai.org ?2. Food Allergy Research and Education (FARE): foodallergy.org ?3. Mothers of Asthmatics: http://www.asthmacommunitynetwork.org ?4. [employer] Communicationsof Allergy, Asthma, and Immunology: wMonthlyElectricBill.co.uk? ? ?COVID-19 Vaccine Information can be found at: [email or web address].[email or web address] questions related to vaccine distribution or appointments, please email [email or web address] or call [phone]  ? ?We realize that you might be concerned about having an allergic reaction to the COVID19 vaccines. To help with that concern, WE ARE OFFERING THE COVID19 VACCINES IN OUR OFFICE! Ask the front desk for dates!  ? ? ? ??Like? u[place]on Facebook and Instagram for our latest updates!  ?  ? ? ?A healthy democracy works best when ANew York Life Insuranceparticipate! Make sure you are registered to vote! If you have moved or changed any of your contact information, you will need to get this updated before voting! ? ?In some cases, you MAY be able to register to vote online: [email or web address].[email or web address]? ? ? ? ?Allergy Shots  ? ?Allergies are the result of a chain reaction that starts in the immune system. Your immune system controls how your body defends itself. For instance, if you have an allergy to pollen, your immune system identifies pollen as an invader or allergen. Your immune system overreacts by producing antibodies called Immunoglobulin E (IgE). These antibodies travel to cells that release chemicals, causing an allergic reaction. ? ?The concept behind allergy immunotherapy, whether it is received in the form of shots or tablets, is that the immune system can be desensitized to specific allergens that trigger allergy symptoms. Although it requires time and patience, the payback can be long-term relief. ? ?How Do Allergy Shots Work? ? ?Allergy shots work much like a vaccine. Your body responds to injected amounts of a particular allergen given in increasing  doses, eventually developing a resistance and tolerance to it. Allergy shots can lead to decreased, minimal or no allergy symptoms. ? ?There generally  are two phases: build-up and maintenance. Build-up often ranges from three to six months and involves receiving injections with increasing amounts of the allergens. The shots are typically given once or twice a week, though more rapid build-up schedules are sometimes used. ? ?The maintenance phase begins when the most effective dose is reached. This dose is different for each person, depending on how allergic you are and your response to the build-up injections. Once the maintenance dose is reached, there are longer periods between injections, typically two to four weeks. ? ?Occasionally doctors give cortisone-type shots that can temporarily reduce allergy symptoms. These types of shots are different and should not be confused with allergy immunotherapy shots. ? ?Who Can Be Treated with Allergy Shots? ? ?Allergy shots may be a good treatment approach for people with allergic rhinitis (hay fever), allergic asthma, conjunctivitis (eye allergy) or stinging insect allergy.  ? ?Before deciding to begin allergy shots, you should consider: ? ? The length of allergy season and the severity of your symptoms ? Whether medications and/or changes to your environment can control your symptoms ? Your desire to avoid long-term medication use ? Time: allergy immunotherapy requires a major time commitment ? Cost: may vary depending on your insurance coverage ? ?Allergy shots for children age 59 and older are effective and often well tolerated. They might prevent the onset of new allergen sensitivities or the progression to asthma. ? ?Allergy shots are not started on patients who are pregnant but can be continued on patients who become pregnant while receiving them. In some patients with other medical conditions or who take certain common medications, allergy shots may be of risk. It is important to mention other medications you talk to your allergist.  ? ?When Will I Feel Better? ? ?Some may experience decreased allergy symptoms  during the build-up phase. For others, it may take as long as 12 months on the maintenance dose. If there is no improvement after a year of maintenance, your allergist will discuss other treatment options with you. ? ?If you aren?t responding to allergy shots, it may be because there is not enough dose of the allergen in your vaccine or there are missing allergens that were not identified during your allergy testing. Other reasons could be that there are high levels of the allergen in your environment or major exposure to non-allergic triggers like tobacco smoke. ? ?What Is the Length of Treatment? ? ?Once the maintenance dose is reached, allergy shots are generally continued for three to five years. The decision to stop should be discussed with your allergist at that time. Some people may experience a permanent reduction of allergy symptoms. Others may relapse and a longer course of allergy shots can be considered. ? ?What Are the Possible Reactions? ? ?The two types of adverse reactions that can occur with allergy shots are local and systemic. Common local reactions include very mild redness and swelling at the injection site, which can happen immediately or several hours after. A systemic reaction, which is less common, affects the entire body or a particular body system. They are usually mild and typically respond quickly to medications. Signs include increased allergy symptoms such as sneezing, a stuffy nose or hives. ? ?Rarely, a serious systemic reaction called anaphylaxis can develop. Symptoms include swelling in the throat, wheezing, a feeling of tightness in the chest, nausea  or dizziness. Most serious systemic reactions develop within 30 minutes of allergy shots. This is why it is strongly recommended you wait in your doctor?s office for 30 minutes after your injections. Your allergist is trained to watch for reactions, and his or her staff is trained and equipped with the proper medications to identify  and treat them. ? ?Who Should Administer Allergy Shots? ? ?The preferred location for receiving shots is your prescribing allergist?s office. Injections can sometimes be given at another facility where the ph

## 2021-05-31 NOTE — Progress Notes (Signed)
? ?FOLLOW UP ? ?Date of Service/Encounter:  05/31/21 ? ? ?Assessment:  ? ?Chronic obstructive pulmonary disease ?  ?Seasonal and perennial allergic rhinitis (grasses, ragweed, trees, indoor molds, outdoor molds, dust mites, cat, dog, and cockroach) ?  ?Rash - on bilateral arms (improved) ?  ?Disabled status ? ?Plan/Recommendations:  ? ?1. Chronic obstructive pulmonary disease ?- Lung testing showed an FEV1 of 33%, but there was some minimal improvement with the albuterol treatment.  ?- We are not going to make any changes since Dr. Melvyn Novas seems to have everything under control. ?- Consider adding on Xolair to help with your breathing (injection that neutralize allergy antibodies to help decrease breathing attacks and can improve lung function). ?- We will work on getting a follow up appointment with Dr. Melvyn Novas.  ? ?2. Seasonal and perennial allergic rhinitis (grasses, ragweed, trees, indoor molds, outdoor molds, dust mites, cat, dog, and cockroach) ?- Consider allergy shots for long term control. ?- CPT codes provided, so check with your insurance company regarding coverage if you want to pursue this.  ?- Continue with: Xyzal (levocetirizine) 20m tablet once daily ?- Continue with: Flonase (fluticasone) two sprays per nostril daily (AIM FOR EAR ON EACH SIDE) and Astelin (azelastine) 2 sprays per nostril 1 time at night.  ?- You can use an extra dose of the antihistamine, if needed, for breakthrough symptoms.  ?- Consider nasal saline rinses 1-2 times daily to remove allergens from the nasal cavities as well as help with mucous clearance (this is especially helpful to do before the nasal sprays are given) ? ?3. Return in about 6 months (around 11/30/2021).  ? ?Subjective:  ? ?Ethan Norton a 46y.o. male presenting today for follow up of  ?Chief Complaint  ?Patient presents with  ? Allergies  ?  Watery eyes, Runny nose, Scratchy throat, Sneezing all symptoms are mainly early in the morning.   ? ? ?Ethan Norton a history of the following: ?Patient Active Problem List  ? Diagnosis Date Noted  ? Hypokalemia 01/31/2021  ? Chronic diarrhea of unknown origin 01/03/2021  ? Generalized abdominal pain 01/03/2021  ? Loss of appetite 01/03/2021  ? Elevated LFTs 11/13/2020  ? Leg swelling 11/07/2020  ? Cigarette smoker 04/18/2018  ? COPD with acute exacerbation ([place] 09/22/2017  ? Alcohol intoxication delirium without use disorder ([place] 01/11/2016  ? Essential hypertension 12/04/2015  ? Chest pain, non-cardiac 07/13/2015  ? COPD GOLD II 05/02/2015  ? VITAMIN B12 DEFICIENCY 10/13/2007  ? ERYTHROCYTOSIS 10/12/2007  ? CONFUSION 10/12/2007  ? ? ?History obtained from: chart review and patient. ? ?Ethan Norton a 46y.o. male presenting for a follow up visit.  He was last seen in February 2023.  At that time, he had an FEV1 of 33% with some minimal improvement with the albuterol treatment.  He was followed by Dr. WMelvyn Novasvery closely, so I did not make any changes.  We did give him information on Xolair in case he wanted to have that direction.  He underwent testing that was positive to grasses, ragweed, trees, indoor and outdoor molds, dust mite, cat, dog, and cockroach.  We continue with Xyzal and added Flonase and Astelin. ? ?Since last visit, he has most done well.  ? ?Asthma/Respiratory Symptom History: He remains on Symbicort two puffs BID. He has albuterol but he rarely needs it.  He has not seen Dr. WMelvyn Novasagain and is supposed to follow up in one year. He denies nighttime coughing. He does  daily SOB. It is worse when he first gets up in the mornings. Once he gets medications, it is OK. He is still thinking about the addition of the Xolair. He has no questions about it today. He was a smoker from age 41 through 2016. He was not a heavy smoker. 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  ? ?He has not had a chest CT at all. He had full pulmonary function testing in May 2017 that  demonstrated: Severe Obstructive Airways Disease with sliight response to bronchodilators. There was also a Moderate Diffusion Defect ? ?Allergic Rhinitis Symptom History: He is doing fairly well from an allergic rhinitis perspective. He is using all of the nose sprays and feels a little better. He has not needed antibiotics for sinus infections or anything.  He is not interested in allergy shots. He does not seem to think that his symptoms are too overly irritating. He has not been on antibiotics at all for his symptoms.  ? ?Otherwise, there have been no changes to his past medical history, surgical history, family history, or social history. ? ? ? ?Review of Systems  ?Constitutional: Negative.  Negative for chills, fever, malaise/fatigue and weight loss.  ?HENT: Negative.  Negative for congestion, ear discharge, ear pain and sinus pain.   ?Eyes:  Negative for pain, discharge and redness.  ?Respiratory:  Positive for cough and shortness of breath. Negative for sputum production, wheezing and stridor.   ?Cardiovascular: Negative.  Negative for chest pain and palpitations.  ?Gastrointestinal:  Negative for abdominal pain, diarrhea, heartburn, nausea and vomiting.  ?Skin: Negative.  Negative for itching and rash.  ?Neurological:  Negative for dizziness and headaches.  ?Endo/Heme/Allergies:  Negative for environmental allergies. Does not bruise/bleed easily.   ? ? ? ?Objective:  ? ?Blood pressure 120/82, pulse (!) 105, resp. rate 16, SpO2 97 %. ?There is no height or weight on file to calculate BMI. ? ? ? ?Physical Exam ?Vitals reviewed.  ?Constitutional:   ?   Appearance: He is underweight.  ?HENT:  ?   Head: Normocephalic and atraumatic.  ?   Right Ear: Tympanic membrane, ear canal and external ear normal. No drainage, swelling or tenderness. Tympanic membrane is not injected, scarred, erythematous, retracted or bulging.  ?   Left Ear: Tympanic membrane, ear canal and external ear normal. No drainage, swelling or  tenderness. Tympanic membrane is not injected, scarred, erythematous, retracted or bulging.  ?   Nose: No nasal deformity, septal deviation, mucosal edema or rhinorrhea.  ?   Right Turbinates: Enlarged, swollen and pale.  ?   Left Turbinates: Enlarged, swollen and pale.  ?   Right Sinus: No maxillary sinus tenderness or frontal sinus tenderness.  ?   Left Sinus: No maxillary sinus tenderness or frontal sinus tenderness.  ?   Mouth/Throat:  ?   Mouth: Mucous membranes are not pale and not dry.  ?   Pharynx: Uvula midline.  ?Eyes:  ?   General:     ?   Right eye: No discharge.     ?   Left eye: No discharge.  ?   Conjunctiva/sclera: Conjunctivae normal.  ?   Right eye: Right conjunctiva is not injected. No chemosis. ?   Left eye: Left conjunctiva is not injected. No chemosis. ?   Pupils: Pupils are equal, round, and reactive to light.  ?Cardiovascular:  ?   Rate and Rhythm: Normal rate and regular rhythm.  ?  Heart sounds: Normal heart sounds.  ?Pulmonary:  ?   Effort: Pulmonary effort is normal. No tachypnea, accessory muscle usage or respiratory distress.  ?   Breath sounds: Rhonchi present. No wheezing or rales.  ?   Comments: Decreased air movement at the bases. Breathing comfortable.  ?Chest:  ?   Chest wall: No tenderness.  ?Abdominal:  ?   Tenderness: There is no abdominal tenderness. There is no guarding or rebound.  ?Lymphadenopathy:  ?   Head:  ?   Right side of head: No submandibular, tonsillar or occipital adenopathy.  ?   Left side of head: No submandibular, tonsillar or occipital adenopathy.  ?   Cervical: No cervical adenopathy.  ?Skin: ?   General: Skin is warm.  ?   Capillary Refill: Capillary refill takes less than 2 seconds.  ?   Coloration: Skin is not pale.  ?   Findings: No abrasion, erythema, petechiae or rash. Rash is not papular, urticarial or vesicular.  ?   Comments: No eczematous or urticarial lesions noted. He does have some erythematous patches on his bilateral arms, which are improved  per the patient.  ?Neurological:  ?   Mental Status: He is alert.  ?Psychiatric:     ?   Behavior: Behavior is cooperative.  ?  ? ?Diagnostic studies:   ? ?Spirometry: results abnormal (FEV1: 1.01/29%, FVC:

## 2021-07-02 ENCOUNTER — Ambulatory Visit: Payer: Federal, State, Local not specified - PPO | Admitting: Nurse Practitioner

## 2021-07-02 ENCOUNTER — Encounter: Payer: Self-pay | Admitting: Nurse Practitioner

## 2021-07-02 VITALS — BP 112/77 | HR 108 | Ht 66.0 in | Wt 108.0 lb

## 2021-07-02 DIAGNOSIS — J449 Chronic obstructive pulmonary disease, unspecified: Secondary | ICD-10-CM | POA: Diagnosis not present

## 2021-07-02 DIAGNOSIS — I10 Essential (primary) hypertension: Secondary | ICD-10-CM | POA: Diagnosis not present

## 2021-07-02 DIAGNOSIS — Z1211 Encounter for screening for malignant neoplasm of colon: Secondary | ICD-10-CM | POA: Diagnosis not present

## 2021-07-02 NOTE — Patient Instructions (Signed)
Thanks for choosing [facility], we consider it a privelige to serve you.  ? ?

## 2021-07-02 NOTE — Assessment & Plan Note (Signed)
Chronic condition well controlled on albuterol inhaler PRN, SYMBICORT 160-4.6mg/act inhaler 2 puffs BID. ?PT encouraged to maintain close follow up with pulmonology.  ?

## 2021-07-02 NOTE — Progress Notes (Addendum)
? ?  [name]     MRN: [id number]      DOB: [birth year] ? ? ?HPI ?Mr. Ethan Norton  with past medical history of hypertension, COPD is here for follow up for hypertension.  States that he has been taking telmisartan 40 mg daily as prescribed. ?. ?The PT denies any adverse reactions to current medications since the last visit.  ? ?He  has been following up with GI for his elevated LFT's.  Due for colon cancer screening, Cologuard ordered today. ? ? ? ?ROS ?Denies recent fever or chills. ?Denies sinus pressure, nasal congestion, ear pain or sore throat. ?Denies chest congestion, productive cough or wheezing. ?Denies chest pains, palpitations and leg swelling ?Denies abdominal pain, nausea, vomiting,diarrhea or constipation.   ?Denies dysuria, frequency, hesitancy or incontinence. ?Denies joint pain, swelling and limitation in mobility. ?Denies headaches, seizures, numbness, or tingling. ? ? ? ?PE ? ?BP 112/77 (BP Location: Right Arm, Patient Position: Sitting, Cuff Size: Normal)   Pulse (!) 108   Ht 5' 6"  (1.676 m)   Wt 108 lb (49 kg)   SpO2 96%   BMI 17.43 kg/m?  ? ?Patient alert and oriented and in no cardiopulmonary distress. ? ?Chest: Clear to auscultation bilaterally. ? ?CVS: S1, S2 no murmurs, no S3.Regular rate. ? ?ABD: Soft non tender.  ? ?Ext: No edema ? ?MS: Adequate ROM spine, shoulders, hips and knees. ? ?Psych: Good eye contact, normal affect. Memory intact not anxious or depressed appearing. ? ? ? ?Assessment & Plan ? ? ? ? ? ?Essential hypertension ?BP Readings from Last 3 Encounters:  ?07/02/21 112/77  ?05/31/21 120/82  ?04/03/21 (!) 152/93  ?chronic condition well controlled on temisartan 21m daily  ?Continue current medication. ?DASH diet advised, exercise daily as tolerated.  ?BMP ordered ? ?COPD GOLD II ?Chronic condition well controlled on albuterol inhaler PRN, SYMBICORT 160-4.549m/act inhaler 2 puffs BID. ?PT encouraged to maintain close follow up with pulmonology.   ?

## 2021-07-02 NOTE — Assessment & Plan Note (Addendum)
BP Readings from Last 3 Encounters:  ?07/02/21 112/77  ?05/31/21 120/82  ?04/03/21 (!) 152/93  ?chronic condition well controlled on temisartan 73m daily  ?Continue current medication. ?DASH diet advised, exercise daily as tolerated.  ?BMP ordered ?

## 2021-07-03 DIAGNOSIS — I10 Essential (primary) hypertension: Secondary | ICD-10-CM | POA: Diagnosis not present

## 2021-07-04 ENCOUNTER — Other Ambulatory Visit: Payer: Self-pay | Admitting: Nurse Practitioner

## 2021-07-04 ENCOUNTER — Telehealth: Payer: Self-pay

## 2021-07-04 ENCOUNTER — Ambulatory Visit: Payer: Federal, State, Local not specified - PPO | Admitting: Internal Medicine

## 2021-07-04 DIAGNOSIS — E876 Hypokalemia: Secondary | ICD-10-CM

## 2021-07-04 LAB — BASIC METABOLIC PANEL
BUN/Creatinine Ratio: 4 — ABNORMAL LOW (ref 9–20)
BUN: 3 mg/dL — ABNORMAL LOW (ref 6–24)
CO2: 26 mmol/L (ref 20–29)
Calcium: 8.3 mg/dL — ABNORMAL LOW (ref 8.7–10.2)
Chloride: 98 mmol/L (ref 96–106)
Creatinine, Ser: 0.78 mg/dL (ref 0.76–1.27)
Glucose: 113 mg/dL — ABNORMAL HIGH (ref 70–99)
Potassium: 2.9 mmol/L — ABNORMAL LOW (ref 3.5–5.2)
Sodium: 143 mmol/L (ref 134–144)
eGFR: 111 mL/min/{1.73_m2} (ref >59)

## 2021-07-04 MED ORDER — POTASSIUM CHLORIDE 20 MEQ/15ML (10%) PO SOLN: ORAL | 1 refills | Status: DC

## 2021-07-04 NOTE — Progress Notes (Signed)
Please review results with patient, potassium level is low, patient should start taking potassium supplement 20 mEq daily, On the first day patient should take 40 mEq afterwards take 20 mEQ  daily, repeat labs in one week    Calcium is low patient should start taking OTC calcium supplements.  Eats lots of foods containing calcium like milk fortified with calcium and vitamin D Thank you  I have added on magnesium to the labs please inform labs today so we can have lasb done today , thanks

## 2021-07-04 NOTE — Telephone Encounter (Signed)
Patient returning lab results call

## 2021-07-06 ENCOUNTER — Other Ambulatory Visit: Payer: Self-pay | Admitting: Nurse Practitioner

## 2021-07-06 LAB — SPECIMEN STATUS REPORT

## 2021-07-06 LAB — MAGNESIUM: Magnesium: 1.4 mg/dL — ABNORMAL LOW (ref 1.6–2.3)

## 2021-07-06 MED ORDER — MAGNESIUM OXIDE 400 240 MG PO PACK: 400.0000 mg | PACK | Freq: Every day | ORAL | 0 refills | Status: DC

## 2021-07-06 MED ORDER — CALCIUM CARB-CHOLECALCIFEROL 600-20 MG-MCG PO TABS: 600.0000 mg | ORAL_TABLET | Freq: Two times a day (BID) | ORAL | 3 refills | Status: AC

## 2021-07-06 NOTE — Progress Notes (Signed)
Magnesium level is low , patient should take magnesium oxide 481m daily for 4 days , should eat plenty of foods containing magnesium like  green peas, sweet corn, potatoes.  Start taking Caltrate 600+D3 , one tablet by mouth twice daily.   Get Labs done  in one week

## 2021-07-10 NOTE — Telephone Encounter (Signed)
Pt has been informed of results by [place].

## 2021-07-16 ENCOUNTER — Telehealth: Payer: Self-pay

## 2021-07-16 NOTE — Telephone Encounter (Signed)
Called pt as a reminder per his request to come in to have labs done, no answer left vm. Please remind him if he calls back

## 2021-07-17 ENCOUNTER — Ambulatory Visit (INDEPENDENT_AMBULATORY_CARE_PROVIDER_SITE_OTHER): Payer: Federal, State, Local not specified - PPO | Admitting: Gastroenterology

## 2021-07-17 ENCOUNTER — Encounter: Payer: Self-pay | Admitting: Gastroenterology

## 2021-07-17 ENCOUNTER — Other Ambulatory Visit: Payer: Self-pay | Admitting: Nurse Practitioner

## 2021-07-17 VITALS — BP 129/84 | HR 90 | Temp 98.3°F | Ht 66.0 in | Wt 107.5 lb

## 2021-07-17 DIAGNOSIS — E876 Hypokalemia: Secondary | ICD-10-CM

## 2021-07-17 DIAGNOSIS — R7989 Other specified abnormal findings of blood chemistry: Secondary | ICD-10-CM | POA: Diagnosis not present

## 2021-07-17 MED ORDER — POTASSIUM CHLORIDE 20 MEQ/15ML (10%) PO SOLN: ORAL | 3 refills | Status: AC

## 2021-07-17 NOTE — Patient Instructions (Signed)
Please take lab orders to [facility] lab when you go on Tuesday.   Further recommendations to follow!   I enjoyed seeing you again today! As you know, I value our relationship and want to provide genuine, compassionate, and quality care. I welcome your feedback. If you receive a survey regarding your visit,  I greatly appreciate you taking time to fill this out. See you next time!  Annitta Needs, PhD, ANP-BC [facility]

## 2021-07-17 NOTE — Progress Notes (Signed)
Gastroenterology Office Note     Primary Care Physician:  Renee Rival, FNP  Primary Gastroenterologist: Dr. Abbey Chatters   Chief Complaint   Chief Complaint  Patient presents with   Follow-up    Patient here today for a follow up visit. At last visit he had some elevated LFT's. Denies any current Gi issues.     History of Present Illness   NAREK KNISS is a 46 y.o. male presenting today in follow-up with a history of elevated LFTs noted in Sept 2022. Pattern of AST more elevated than ALT. History of ETOH use in the past but now just occasionally. Known fatty liver. Hep C antibody negative. Hep B surface antibody positive consistent with immunmity. Hep B surface antigen negative. US abdomen with fatty liver in Nov 2022. IgM mildly elevated. IgA elevated but IgG normal. ANA, ASMA, AMA negative. Iron sats 85, ferritin elevated at 1351 but DNA mutation not seen for hemochromatosis. Ceruloplasmin was below 20, and urine copper was low. Less likely dealing with Wilson's disease. Recommending repeat HFP and consideration of liver biopsy.   Cologuard in process through PCP. He is wanting to hold off on colonoscopy unless positive. No dysphagia, abdominal pain, N/V, overt GI bleeding. No herbal supplements. No jaundice, mental status changes, or confusion. Denies ETOH use.     Past Medical History:  Diagnosis Date   Anxiety    COPD (chronic obstructive pulmonary disease) (HCC)    Depression    Hypertension    Neuropathic pain    RA (rheumatoid arthritis) ([place])     Past Surgical History:  Procedure Laterality Date   WISDOM TOOTH EXTRACTION      Current Outpatient Medications  Medication Sig Dispense Refill   albuterol (PROVENTIL) (2.5 MG/3ML) 0.083% nebulizer solution Take 3 mLs (2.5 mg total) by nebulization every 4 (four) hours as needed for wheezing or shortness of breath.     albuterol (VENTOLIN HFA) 108 (90 Base) MCG/ACT inhaler INHALE 2 PUFFS INTO THE  LUNGS EVERY 6 HOURS AS NEEDED FOR WHEEZING OR SHORTNESS OF BREATH 8.5 g 2   azelastine (ASTELIN) 0.1 % nasal spray Place 2 sprays into both nostrils at bedtime. Use in each nostril as directed 30 mL 5   budesonide-formoterol (SYMBICORT) 160-4.5 MCG/ACT inhaler INHALE 2 PUFFS INTO THE LUNGS FIRST THING IN THE MORNING THEN INHALE 2 PUFFS BY MOUTH 12 HOURS LATER 10.2 g 11   Calcium Carb-Cholecalciferol (CALTRATE 600+D3) 600-20 MG-MCG TABS Take 600 mg by mouth 2 (two) times daily. 60 tablet 3   fluticasone (FLONASE) 50 MCG/ACT nasal spray Place 2 sprays into both nostrils at bedtime. 16 g 5   Ibuprofen 200 MG CAPS Take 200-400 mg by mouth every 8 (eight) hours as needed.     levocetirizine (XYZAL) 5 MG tablet Take 1 tablet (5 mg total) by mouth 2 (two) times daily as needed (Can take an extra dose during flare ups.). (Patient taking differently: Take 5 mg by mouth daily as needed (Can take an extra dose during flare ups.).) 60 tablet 5   Loperamide HCl (IMODIUM PO) Take by mouth. As needed     Magnesium Oxide (MAGNESIUM OXIDE 400) 240 MG PACK Take 400 mg by mouth daily. 4 each 0   Multiple Vitamin (MULTIVITAMIN) tablet Take 1 tablet by mouth every evening.      omeprazole (PRILOSEC) 20 MG capsule Take 20 mg by mouth daily.     telmisartan (MICARDIS) 40 MG tablet Take 1  tablet (40 mg total) by mouth daily. 30 tablet 11   potassium chloride 20 MEQ/15ML (10%) SOLN TAKE 15 MLS BY MOUTH EVERY DAY. ON THE FIRST DAY TAKE 30 MLS THEN 15 MLS THEREAFTER. (Patient not taking: Reported on 07/17/2021) 473 mL 1   Current Facility-Administered Medications  Medication Dose Route Frequency Provider Last Rate Last Admin   feeding supplement (ENSURE ENLIVE / ENSURE PLUS) liquid 237 mL  237 mL Oral BID BM Paseda, Folashade R, FNP        Allergies as of 07/17/2021 - Review Complete 07/17/2021  Allergen Reaction Noted   Epinephrine  10/12/2007    Family History  Problem Relation Age of Onset   Ovarian cancer Mother  30       metastatic to bowels   Diabetes Mother    Hypertension Mother    Colon polyps Mother    Huntington's disease Father        died at 64   Huntington's disease Brother    Colon cancer Maternal Grandmother    Heart attack Maternal Grandfather    Emphysema Paternal Grandmother    Rheum arthritis Paternal Grandmother    Lung cancer Paternal Grandmother    Heart attack Paternal Grandfather     Social History   Socioeconomic History   Marital status: Married    Spouse name: Not on file   Number of children: 3   Years of education: Not on file   Highest education level: Some college, no degree  Occupational History   Not on file  Tobacco Use   Smoking status: Former    Packs/day: 1.50    Years: 21.00    Pack years: 31.50    Types: Cigarettes    Quit date: 02/17/2014    Years since quitting: 7.4   Smokeless tobacco: Never  Vaping Use   Vaping Use: Never used  Substance and Sexual Activity   Alcohol use: Not Currently    Alcohol/week: 2.0 standard drinks    Types: 2 Glasses of wine per week    Comment: occassional 2 glasses of wine a week   Drug use: No   Sexual activity: Yes    Comment: has one sexual patner  Other Topics Concern   Not on file  Social History Narrative   Not on file   Social Determinants of Health   Financial Resource Strain: Not on file  Food Insecurity: No Food Insecurity   Worried About Charity fundraiser in the Last Year: Never true   Ran Out of Food in the Last Year: Never true  Transportation Needs: Not on file  Physical Activity: Not on file  Stress: Not on file  Social Connections: Not on file  Intimate Partner Violence: Not on file     Review of Systems   Gen: Denies any fever, chills, fatigue, weight loss, lack of appetite.  CV: Denies chest pain, heart palpitations, peripheral edema, syncope.  Resp: Denies shortness of breath at rest or with exertion. Denies wheezing or cough.  GI: see HPI GU : Denies urinary burning,  urinary frequency, urinary hesitancy MS: Denies joint pain, muscle weakness, cramps, or limitation of movement.  Derm: Denies rash, itching, dry skin Psych: Denies depression, anxiety, memory loss, and confusion Heme: Denies bruising, bleeding, and enlarged lymph nodes.   Physical Exam   BP 129/84 (BP Location: Right Arm, Patient Position: Sitting, Cuff Size: Small)   Pulse 90   Temp 98.3 F (36.8 C) (Oral)   Ht 5' 6"  (1.676  m)   Wt 107 lb 8 oz (48.8 kg)   BMI 17.35 kg/m  General:   Alert and oriented. Pleasant and cooperative. Thin but well-nourished.  Head:  Normocephalic and atraumatic. Eyes:  Without icterus Abdomen:  +BS, soft, non-tender and non-distended. No HSM noted. No guarding or rebound. No masses appreciated.  Rectal:  Deferred  Msk:  Symmetrical without gross deformities. Normal posture. Extremities:  Without edema. Neurologic:  Alert and  oriented x4;  grossly normal neurologically. Skin:  Intact without significant lesions or rashes. Psych:  Alert and cooperative. Normal mood and affect.   Assessment   JARRAD MCLEES is a 46 y.o. male presenting today in follow-up with a history of elevated LFTs noted in Sept 2022 and no recent prior labs to compare. Pattern of elevation with AST predominantly elevated; he notes ETOH use in the past but none currently. Known fatty liver.   Thorough serologies have been completed. Iron sats elevated but DNA mutation for hemochromatosis was not seen. Ceruloplasmin was below 20 and urine copper low, making Wilson's disease less likely. I had recommended liver biopsy vs repeat HFP; he has opted to pursue repeat HFP.   We will check HFP and then decide next steps. Continue to avoid ETOH.  Cologuard pending through PCP. He is wanting to hold off on colonoscopy unless absolutely needed.   PLAN    Repeat HFP Further recommendations to follow    Annitta Needs, PhD, ANP-BC [facility]

## 2021-07-18 ENCOUNTER — Other Ambulatory Visit: Payer: Self-pay | Admitting: Nurse Practitioner

## 2021-07-18 LAB — MAGNESIUM: Magnesium: 1.5 mg/dL — ABNORMAL LOW (ref 1.6–2.3)

## 2021-07-18 LAB — COLOGUARD: COLOGUARD: NEGATIVE

## 2021-07-18 LAB — CALCIUM: Calcium: 8.4 mg/dL — ABNORMAL LOW (ref 8.7–10.2)

## 2021-07-18 LAB — POTASSIUM: Potassium: 3.5 mmol/L (ref 3.5–5.2)

## 2021-07-18 MED ORDER — MAGNESIUM OXIDE 400 240 MG PO PACK: 400.0000 mg | PACK | Freq: Every day | ORAL | 3 refills | Status: DC

## 2021-07-18 NOTE — Progress Notes (Signed)
Magnesium level remains low, patient should start taking magnesium 400 mg daily Potassium level is back to normal, patient should continue to take potassium 20 mEq daily. Med refills sent to the pharmacy   Continue to take Caltrate 600 plus D3 2 times daily  We will recheck labs in one month to ensure adequate supplementation.  Please tell patient to have magnesium and potassium labs done in a month.

## 2021-07-23 ENCOUNTER — Inpatient Hospital Stay (HOSPITAL_COMMUNITY): Payer: Federal, State, Local not specified - PPO | Attending: Hematology

## 2021-07-23 ENCOUNTER — Other Ambulatory Visit (HOSPITAL_COMMUNITY)
Admission: RE | Admit: 2021-07-23 | Discharge: 2021-07-23 | Disposition: A | Discharge status: Home / Self Care | Payer: Federal, State, Local not specified - PPO | Source: Ambulatory Visit | Attending: Gastroenterology | Admitting: Gastroenterology

## 2021-07-23 DIAGNOSIS — R7989 Other specified abnormal findings of blood chemistry: Secondary | ICD-10-CM | POA: Diagnosis not present

## 2021-07-23 LAB — HEPATIC FUNCTION PANEL
ALT: 65 U/L — ABNORMAL HIGH (ref 0–44)
AST: 152 U/L — ABNORMAL HIGH (ref 15–41)
Albumin: 2.4 g/dL — ABNORMAL LOW (ref 3.5–5.0)
Alkaline Phosphatase: 138 U/L — ABNORMAL HIGH (ref 38–126)
Bilirubin, Direct: 0.7 mg/dL — ABNORMAL HIGH (ref 0.0–0.2)
Indirect Bilirubin: 0.9 mg/dL (ref 0.3–0.9)
Total Bilirubin: 1.6 mg/dL — ABNORMAL HIGH (ref 0.3–1.2)
Total Protein: 5.3 g/dL — ABNORMAL LOW (ref 6.5–8.1)

## 2021-07-29 NOTE — Progress Notes (Deleted)
RESCHEDULE

## 2021-07-30 ENCOUNTER — Encounter: Payer: Self-pay | Admitting: *Deleted

## 2021-07-30 ENCOUNTER — Other Ambulatory Visit: Payer: Self-pay | Admitting: *Deleted

## 2021-07-30 ENCOUNTER — Inpatient Hospital Stay (HOSPITAL_COMMUNITY): Payer: Federal, State, Local not specified - PPO | Admitting: Physician Assistant

## 2021-07-30 DIAGNOSIS — R7989 Other specified abnormal findings of blood chemistry: Secondary | ICD-10-CM

## 2021-08-07 ENCOUNTER — Inpatient Hospital Stay (HOSPITAL_COMMUNITY): Payer: Federal, State, Local not specified - PPO

## 2021-08-07 DIAGNOSIS — D7589 Other specified diseases of blood and blood-forming organs: Secondary | ICD-10-CM | POA: Insufficient documentation

## 2021-08-07 LAB — CBC WITH DIFFERENTIAL/PLATELET
Abs Immature Granulocytes: 0.02 10*3/uL (ref 0.00–0.07)
Basophils Absolute: 0 10*3/uL (ref 0.0–0.1)
Basophils Relative: 0 %
Eosinophils Absolute: 0.1 10*3/uL (ref 0.0–0.5)
Eosinophils Relative: 1 %
HCT: 30.4 % — ABNORMAL LOW (ref 39.0–52.0)
Hemoglobin: 10.6 g/dL — ABNORMAL LOW (ref 13.0–17.0)
Immature Granulocytes: 0 %
Lymphocytes Relative: 22 %
Lymphs Abs: 1.6 10*3/uL (ref 0.7–4.0)
MCH: 40.2 pg — ABNORMAL HIGH (ref 26.0–34.0)
MCHC: 34.9 g/dL (ref 30.0–36.0)
MCV: 115.2 fL — ABNORMAL HIGH (ref 80.0–100.0)
Monocytes Absolute: 0.6 10*3/uL (ref 0.1–1.0)
Monocytes Relative: 8 %
Neutro Abs: 5.2 10*3/uL (ref 1.7–7.7)
Neutrophils Relative %: 69 %
Platelets: 145 10*3/uL — ABNORMAL LOW (ref 150–400)
RBC: 2.64 MIL/uL — ABNORMAL LOW (ref 4.22–5.81)
RDW: 12.6 % (ref 11.5–15.5)
WBC: 7.6 10*3/uL (ref 4.0–10.5)
nRBC: 0 % (ref 0.0–0.2)

## 2021-08-07 LAB — FOLATE: Folate: 38.8 ng/mL (ref >5.9)

## 2021-08-08 LAB — HOMOCYSTEINE: Homocysteine: 36.4 umol/L — ABNORMAL HIGH (ref 0.0–14.5)

## 2021-08-12 ENCOUNTER — Other Ambulatory Visit: Payer: Self-pay

## 2021-08-12 ENCOUNTER — Encounter (HOSPITAL_COMMUNITY): Payer: Self-pay | Admitting: Emergency Medicine

## 2021-08-12 ENCOUNTER — Emergency Department (HOSPITAL_COMMUNITY): Payer: Federal, State, Local not specified - PPO

## 2021-08-12 ENCOUNTER — Inpatient Hospital Stay (HOSPITAL_COMMUNITY)
Admission: EM | Admit: 2021-08-12 | Discharge: 2021-08-17 | DRG: 441 | Disposition: E | Payer: Federal, State, Local not specified - PPO | Attending: Family Medicine | Admitting: Family Medicine

## 2021-08-12 ENCOUNTER — Ambulatory Visit
Admission: EM | Admit: 2021-08-12 | Discharge: 2021-08-12 | Disposition: A | Discharge status: Home / Self Care | Payer: Federal, State, Local not specified - PPO | Attending: Family Medicine | Admitting: Family Medicine

## 2021-08-12 DIAGNOSIS — E876 Hypokalemia: Secondary | ICD-10-CM | POA: Diagnosis not present

## 2021-08-12 DIAGNOSIS — R17 Unspecified jaundice: Secondary | ICD-10-CM

## 2021-08-12 DIAGNOSIS — M069 Rheumatoid arthritis, unspecified: Secondary | ICD-10-CM | POA: Diagnosis present

## 2021-08-12 DIAGNOSIS — Z87891 Personal history of nicotine dependence: Secondary | ICD-10-CM

## 2021-08-12 DIAGNOSIS — R001 Bradycardia, unspecified: Secondary | ICD-10-CM | POA: Diagnosis not present

## 2021-08-12 DIAGNOSIS — Z8371 Family history of colonic polyps: Secondary | ICD-10-CM

## 2021-08-12 DIAGNOSIS — K7200 Acute and subacute hepatic failure without coma: Secondary | ICD-10-CM | POA: Diagnosis not present

## 2021-08-12 DIAGNOSIS — I10 Essential (primary) hypertension: Secondary | ICD-10-CM | POA: Diagnosis not present

## 2021-08-12 DIAGNOSIS — I700 Atherosclerosis of aorta: Secondary | ICD-10-CM | POA: Diagnosis not present

## 2021-08-12 DIAGNOSIS — D689 Coagulation defect, unspecified: Secondary | ICD-10-CM | POA: Diagnosis not present

## 2021-08-12 DIAGNOSIS — R34 Anuria and oliguria: Secondary | ICD-10-CM | POA: Diagnosis not present

## 2021-08-12 DIAGNOSIS — D72829 Elevated white blood cell count, unspecified: Secondary | ICD-10-CM | POA: Diagnosis present

## 2021-08-12 DIAGNOSIS — F32A Depression, unspecified: Secondary | ICD-10-CM | POA: Diagnosis present

## 2021-08-12 DIAGNOSIS — G931 Anoxic brain damage, not elsewhere classified: Secondary | ICD-10-CM | POA: Diagnosis not present

## 2021-08-12 DIAGNOSIS — R630 Anorexia: Secondary | ICD-10-CM

## 2021-08-12 DIAGNOSIS — K559 Vascular disorder of intestine, unspecified: Secondary | ICD-10-CM | POA: Diagnosis present

## 2021-08-12 DIAGNOSIS — D649 Anemia, unspecified: Secondary | ICD-10-CM | POA: Diagnosis not present

## 2021-08-12 DIAGNOSIS — R627 Adult failure to thrive: Secondary | ICD-10-CM | POA: Diagnosis present

## 2021-08-12 DIAGNOSIS — R791 Abnormal coagulation profile: Secondary | ICD-10-CM | POA: Diagnosis present

## 2021-08-12 DIAGNOSIS — R64 Cachexia: Secondary | ICD-10-CM | POA: Diagnosis present

## 2021-08-12 DIAGNOSIS — I81 Portal vein thrombosis: Secondary | ICD-10-CM | POA: Diagnosis present

## 2021-08-12 DIAGNOSIS — Z515 Encounter for palliative care: Secondary | ICD-10-CM | POA: Diagnosis not present

## 2021-08-12 DIAGNOSIS — E778 Other disorders of glycoprotein metabolism: Secondary | ICD-10-CM | POA: Diagnosis present

## 2021-08-12 DIAGNOSIS — R1084 Generalized abdominal pain: Secondary | ICD-10-CM | POA: Diagnosis not present

## 2021-08-12 DIAGNOSIS — Z8249 Family history of ischemic heart disease and other diseases of the circulatory system: Secondary | ICD-10-CM

## 2021-08-12 DIAGNOSIS — Z681 Body mass index (BMI) 19 or less, adult: Secondary | ICD-10-CM | POA: Diagnosis not present

## 2021-08-12 DIAGNOSIS — N179 Acute kidney failure, unspecified: Secondary | ICD-10-CM | POA: Diagnosis not present

## 2021-08-12 DIAGNOSIS — Z833 Family history of diabetes mellitus: Secondary | ICD-10-CM

## 2021-08-12 DIAGNOSIS — K767 Hepatorenal syndrome: Secondary | ICD-10-CM | POA: Diagnosis not present

## 2021-08-12 DIAGNOSIS — R7989 Other specified abnormal findings of blood chemistry: Secondary | ICD-10-CM | POA: Diagnosis present

## 2021-08-12 DIAGNOSIS — E8809 Other disorders of plasma-protein metabolism, not elsewhere classified: Secondary | ICD-10-CM | POA: Diagnosis present

## 2021-08-12 DIAGNOSIS — J449 Chronic obstructive pulmonary disease, unspecified: Secondary | ICD-10-CM | POA: Diagnosis not present

## 2021-08-12 DIAGNOSIS — R140 Abdominal distension (gaseous): Secondary | ICD-10-CM

## 2021-08-12 DIAGNOSIS — R188 Other ascites: Secondary | ICD-10-CM | POA: Diagnosis present

## 2021-08-12 DIAGNOSIS — E872 Acidosis, unspecified: Secondary | ICD-10-CM | POA: Diagnosis present

## 2021-08-12 DIAGNOSIS — K219 Gastro-esophageal reflux disease without esophagitis: Secondary | ICD-10-CM | POA: Diagnosis not present

## 2021-08-12 DIAGNOSIS — I959 Hypotension, unspecified: Secondary | ICD-10-CM | POA: Diagnosis present

## 2021-08-12 DIAGNOSIS — R579 Shock, unspecified: Secondary | ICD-10-CM | POA: Diagnosis not present

## 2021-08-12 DIAGNOSIS — Z888 Allergy status to other drugs, medicaments and biological substances status: Secondary | ICD-10-CM

## 2021-08-12 DIAGNOSIS — E860 Dehydration: Secondary | ICD-10-CM | POA: Diagnosis present

## 2021-08-12 DIAGNOSIS — Z66 Do not resuscitate: Secondary | ICD-10-CM | POA: Diagnosis not present

## 2021-08-12 DIAGNOSIS — R7401 Elevation of levels of liver transaminase levels: Secondary | ICD-10-CM | POA: Diagnosis not present

## 2021-08-12 DIAGNOSIS — E7220 Disorder of urea cycle metabolism, unspecified: Secondary | ICD-10-CM | POA: Diagnosis present

## 2021-08-12 DIAGNOSIS — K920 Hematemesis: Secondary | ICD-10-CM | POA: Diagnosis present

## 2021-08-12 DIAGNOSIS — R197 Diarrhea, unspecified: Secondary | ICD-10-CM | POA: Diagnosis present

## 2021-08-12 DIAGNOSIS — R0681 Apnea, not elsewhere classified: Secondary | ICD-10-CM | POA: Diagnosis not present

## 2021-08-12 DIAGNOSIS — D539 Nutritional anemia, unspecified: Secondary | ICD-10-CM | POA: Diagnosis not present

## 2021-08-12 DIAGNOSIS — B370 Candidal stomatitis: Secondary | ICD-10-CM | POA: Diagnosis present

## 2021-08-12 DIAGNOSIS — K760 Fatty (change of) liver, not elsewhere classified: Secondary | ICD-10-CM | POA: Diagnosis present

## 2021-08-12 DIAGNOSIS — R0789 Other chest pain: Secondary | ICD-10-CM | POA: Diagnosis not present

## 2021-08-12 DIAGNOSIS — R112 Nausea with vomiting, unspecified: Secondary | ICD-10-CM

## 2021-08-12 DIAGNOSIS — J439 Emphysema, unspecified: Secondary | ICD-10-CM | POA: Diagnosis not present

## 2021-08-12 DIAGNOSIS — Z79899 Other long term (current) drug therapy: Secondary | ICD-10-CM

## 2021-08-12 DIAGNOSIS — Z801 Family history of malignant neoplasm of trachea, bronchus and lung: Secondary | ICD-10-CM

## 2021-08-12 DIAGNOSIS — K922 Gastrointestinal hemorrhage, unspecified: Secondary | ICD-10-CM | POA: Diagnosis not present

## 2021-08-12 DIAGNOSIS — Z825 Family history of asthma and other chronic lower respiratory diseases: Secondary | ICD-10-CM

## 2021-08-12 DIAGNOSIS — E162 Hypoglycemia, unspecified: Secondary | ICD-10-CM | POA: Diagnosis present

## 2021-08-12 DIAGNOSIS — R000 Tachycardia, unspecified: Secondary | ICD-10-CM | POA: Diagnosis present

## 2021-08-12 DIAGNOSIS — F419 Anxiety disorder, unspecified: Secondary | ICD-10-CM | POA: Diagnosis present

## 2021-08-12 DIAGNOSIS — Z7951 Long term (current) use of inhaled steroids: Secondary | ICD-10-CM

## 2021-08-12 DIAGNOSIS — K7689 Other specified diseases of liver: Secondary | ICD-10-CM | POA: Diagnosis present

## 2021-08-12 DIAGNOSIS — Z800 Family history of malignant neoplasm of digestive organs: Secondary | ICD-10-CM

## 2021-08-12 DIAGNOSIS — Z8041 Family history of malignant neoplasm of ovary: Secondary | ICD-10-CM

## 2021-08-12 DIAGNOSIS — R748 Abnormal levels of other serum enzymes: Secondary | ICD-10-CM | POA: Diagnosis present

## 2021-08-12 LAB — CBC
HCT: 34.7 % — ABNORMAL LOW (ref 39.0–52.0)
Hemoglobin: 11.7 g/dL — ABNORMAL LOW (ref 13.0–17.0)
MCH: 39.8 pg — ABNORMAL HIGH (ref 26.0–34.0)
MCHC: 33.7 g/dL (ref 30.0–36.0)
MCV: 118 fL — ABNORMAL HIGH (ref 80.0–100.0)
Platelets: 171 10*3/uL (ref 150–400)
RBC: 2.94 MIL/uL — ABNORMAL LOW (ref 4.22–5.81)
RDW: 12.4 % (ref 11.5–15.5)
WBC: 12.5 10*3/uL — ABNORMAL HIGH (ref 4.0–10.5)
nRBC: 0 % (ref 0.0–0.2)

## 2021-08-12 LAB — RAPID HIV SCREEN (HIV 1/2 AB+AG)
HIV 1/2 Antibodies: NONREACTIVE
HIV-1 P24 Antigen - HIV24: NONREACTIVE

## 2021-08-12 LAB — COMPREHENSIVE METABOLIC PANEL
ALT: 74 U/L — ABNORMAL HIGH (ref 0–44)
AST: 187 U/L — ABNORMAL HIGH (ref 15–41)
Albumin: 2.2 g/dL — ABNORMAL LOW (ref 3.5–5.0)
Alkaline Phosphatase: 254 U/L — ABNORMAL HIGH (ref 38–126)
Anion gap: 16 — ABNORMAL HIGH (ref 5–15)
BUN: 15 mg/dL (ref 6–20)
CO2: 22 mmol/L (ref 22–32)
Calcium: 7.3 mg/dL — ABNORMAL LOW (ref 8.9–10.3)
Chloride: 92 mmol/L — ABNORMAL LOW (ref 98–111)
Creatinine, Ser: 3.97 mg/dL — ABNORMAL HIGH (ref 0.61–1.24)
GFR, Estimated: 18 mL/min — ABNORMAL LOW (ref >60)
Glucose, Bld: 101 mg/dL — ABNORMAL HIGH (ref 70–99)
Potassium: 3.3 mmol/L — ABNORMAL LOW (ref 3.5–5.1)
Sodium: 130 mmol/L — ABNORMAL LOW (ref 135–145)
Total Bilirubin: 15.6 mg/dL — ABNORMAL HIGH (ref 0.3–1.2)
Total Protein: 6 g/dL — ABNORMAL LOW (ref 6.5–8.1)

## 2021-08-12 LAB — ACETAMINOPHEN LEVEL: Acetaminophen (Tylenol), Serum: <10 ug/mL — ABNORMAL LOW (ref 10–30)

## 2021-08-12 LAB — PROTIME-INR
INR: 1.3 — ABNORMAL HIGH (ref 0.8–1.2)
Prothrombin Time: 16.3 seconds — ABNORMAL HIGH (ref 11.4–15.2)

## 2021-08-12 LAB — ETHANOL: Alcohol, Ethyl (B): <10 mg/dL (ref <10)

## 2021-08-12 LAB — SALICYLATE LEVEL: Salicylate Lvl: 10.6 mg/dL (ref 7.0–30.0)

## 2021-08-12 LAB — HEPATITIS PANEL, ACUTE
HCV Ab: NONREACTIVE
Hep A IgM: NONREACTIVE
Hep B C IgM: NONREACTIVE
Hepatitis B Surface Ag: NONREACTIVE

## 2021-08-12 LAB — LIPASE, BLOOD: Lipase: 26 U/L (ref 11–51)

## 2021-08-12 MED ORDER — ONDANSETRON HCL 4 MG/2ML IJ SOLN
4.0000 mg | Freq: Once | INTRAMUSCULAR | Status: AC
  Administered 2021-08-12: 4 mg via INTRAVENOUS
  Filled 2021-08-12: qty 2

## 2021-08-12 MED ORDER — ALBUMIN HUMAN 25 % IV SOLN
INTRAVENOUS | Status: AC
  Filled 2021-08-12: qty 300

## 2021-08-12 MED ORDER — MORPHINE SULFATE (PF) 2 MG/ML IV SOLN
2.0000 mg | INTRAVENOUS | Status: DC | PRN
  Administered 2021-08-12 – 2021-08-13 (×3): 2 mg via INTRAVENOUS
  Filled 2021-08-12 (×3): qty 1

## 2021-08-12 MED ORDER — MAGIC MOUTHWASH
15.0000 mL | Freq: Three times a day (TID) | ORAL | Status: DC
  Administered 2021-08-12: 15 mL via ORAL
  Filled 2021-08-12 (×2): qty 15

## 2021-08-12 MED ORDER — MOMETASONE FURO-FORMOTEROL FUM 200-5 MCG/ACT IN AERO
2.0000 | INHALATION_SPRAY | Freq: Two times a day (BID) | RESPIRATORY_TRACT | Status: DC
  Administered 2021-08-13: 2 via RESPIRATORY_TRACT
  Filled 2021-08-12: qty 8.8

## 2021-08-12 MED ORDER — ENSURE ENLIVE PO LIQD
237.0000 mL | Freq: Two times a day (BID) | ORAL | Status: DC
  Filled 2021-08-12 (×2): qty 237

## 2021-08-12 MED ORDER — ONDANSETRON HCL 4 MG PO TABS: 4.0000 mg | ORAL_TABLET | Freq: Four times a day (QID) | ORAL | Status: DC | PRN

## 2021-08-12 MED ORDER — HYDROMORPHONE HCL 1 MG/ML IJ SOLN
1.0000 mg | Freq: Once | INTRAMUSCULAR | Status: AC
  Administered 2021-08-12: 1 mg via INTRAVENOUS
  Filled 2021-08-12: qty 1

## 2021-08-12 MED ORDER — HEPARIN SODIUM (PORCINE) 5000 UNIT/ML IJ SOLN
5000.0000 [IU] | Freq: Three times a day (TID) | INTRAMUSCULAR | Status: DC
Start: 2021-08-12 — End: 2021-08-13
  Administered 2021-08-12 – 2021-08-13 (×2): 5000 [IU] via SUBCUTANEOUS
  Filled 2021-08-12 (×2): qty 1

## 2021-08-12 MED ORDER — ALBUTEROL SULFATE (2.5 MG/3ML) 0.083% IN NEBU
2.5000 mg | INHALATION_SOLUTION | RESPIRATORY_TRACT | Status: DC | PRN
  Administered 2021-08-13: 2.5 mg via RESPIRATORY_TRACT
  Filled 2021-08-12: qty 3

## 2021-08-12 MED ORDER — TRAMADOL HCL 50 MG PO TABS
50.0000 mg | ORAL_TABLET | Freq: Two times a day (BID) | ORAL | Status: DC | PRN
  Administered 2021-08-12: 50 mg via ORAL
  Filled 2021-08-12: qty 1

## 2021-08-12 MED ORDER — LOPERAMIDE HCL 2 MG PO CAPS
4.0000 mg | ORAL_CAPSULE | Freq: Once | ORAL | Status: DC
  Filled 2021-08-12: qty 2

## 2021-08-12 MED ORDER — SODIUM CHLORIDE 0.9 % IV BOLUS
1000.0000 mL | Freq: Once | INTRAVENOUS | Status: AC
  Administered 2021-08-12: 1000 mL via INTRAVENOUS

## 2021-08-12 MED ORDER — PANTOPRAZOLE SODIUM 40 MG PO TBEC: 40.0000 mg | DELAYED_RELEASE_TABLET | Freq: Every day | ORAL | Status: DC

## 2021-08-12 MED ORDER — ALBUMIN HUMAN 25 % IV SOLN
75.0000 g | Freq: Once | INTRAVENOUS | Status: AC
  Administered 2021-08-12: 75 g via INTRAVENOUS
  Filled 2021-08-12: qty 300

## 2021-08-12 MED ORDER — PIPERACILLIN-TAZOBACTAM 3.375 G IVPB 30 MIN
3.3750 g | Freq: Once | INTRAVENOUS | Status: AC
  Administered 2021-08-12: 3.375 g via INTRAVENOUS
  Filled 2021-08-12: qty 50

## 2021-08-12 MED ORDER — LOPERAMIDE HCL 2 MG PO CAPS: 2.0000 mg | ORAL_CAPSULE | ORAL | Status: DC | PRN

## 2021-08-12 MED ORDER — ONDANSETRON HCL 4 MG/2ML IJ SOLN
4.0000 mg | Freq: Four times a day (QID) | INTRAMUSCULAR | Status: DC | PRN
  Administered 2021-08-12: 4 mg via INTRAVENOUS
  Filled 2021-08-12: qty 2

## 2021-08-12 MED ORDER — POTASSIUM CHLORIDE 10 MEQ/100ML IV SOLN
10.0000 meq | INTRAVENOUS | Status: AC
  Administered 2021-08-12 (×2): 10 meq via INTRAVENOUS
  Filled 2021-08-12 (×2): qty 100

## 2021-08-12 MED ORDER — PIPERACILLIN-TAZOBACTAM IN DEX 2-0.25 GM/50ML IV SOLN
2.2500 g | Freq: Three times a day (TID) | INTRAVENOUS | Status: DC
  Administered 2021-08-13: 2.25 g via INTRAVENOUS
  Filled 2021-08-12 (×5): qty 50

## 2021-08-12 MED ORDER — PIPERACILLIN-TAZOBACTAM 3.375 G IVPB 30 MIN: 3.3750 g | Freq: Three times a day (TID) | INTRAVENOUS | Status: DC

## 2021-08-12 MED ORDER — TRAMADOL HCL 50 MG PO TABS: 50.0000 mg | ORAL_TABLET | Freq: Four times a day (QID) | ORAL | Status: DC | PRN

## 2021-08-12 NOTE — ED Provider Triage Note (Signed)
Emergency Medicine Provider Triage Evaluation Note  Ethan Norton , a 46 y.o. male  was evaluated in triage.  Pt complains of abdominal pain, nausea, vomiting, diarrhea for 5 days.  No aggravating or alleviating factors.  Has been taking ibuprofen and Aleve without relief.  History of elevated LFTs and fatty liver, follows with GI.  Review of Systems  Positive: As above Negative: Fever, urinary symptoms  Physical Exam  BP 97/72 (BP Location: Right Arm)   Pulse (!) 105   Temp 98.2 F (36.8 C) (Oral)   Resp 18   Ht 5' 6"  (1.676 m)   Wt 49 kg   SpO2 97%   BMI 17.43 kg/m  Gen:   Awake, no distress   Resp:  Normal effort  MSK:   Moves extremities without difficulty  Other:  Generalized abdominal tenderness with mild guarding, appears pale and slightly jaundiced  Medical Decision Making  Medically screening exam initiated at 3:35 PM.  Appropriate orders placed.  Ethan Norton was informed that the remainder of the evaluation will be completed by another provider, this initial triage assessment does not replace that evaluation, and the importance of remaining in the ED until their evaluation is complete.     Kateri Plummer, PA-C 08/10/2021 1537

## 2021-08-12 NOTE — Assessment & Plan Note (Signed)
-  Replace and recheck -continue to monitor

## 2021-08-12 NOTE — ED Provider Notes (Signed)
[facility] EMERGENCY DEPARTMENT Provider Note   CSN: [id number] Arrival date & time: 08/12/21  1354     History  Chief Complaint  Patient presents with   Abdominal Pain    Ethan Norton is a 46 y.o. male.   Abdominal Pain   This patient is a 46 year old male, he has a history of tobacco use and has been diagnosed with COPD as well as asthma.  He is on Symbicort twice a day for at least 8 years, history of hypertension on an angiotensin receptor blocker and a history of acid reflux on omeprazole.  He unfortunately has struggled with low level elevated liver function test and has been worked up with ultrasound, thought to have fatty liver but is currently in the process of being further worked up and had an MRCP that was ordered for tomorrow.  Unfortunately over the last several days the patient has developed increasing nausea vomiting diarrhea and diffuse abdominal pain which brings him into the hospital today feeling extremely weak.  On arrival the patient was found to be hypotensive, tachycardic, jaundiced and ill-appearing and a CT scan was ordered and the patient was brought immediately back to her room.  He reports to me that nobody has been sick around him, he has not been traveling, he has no history of IV drug use, no history of liver failure in the past.  Home Medications Prior to Admission medications   Medication Sig Start Date End Date Taking? Authorizing Provider  albuterol (PROVENTIL) (2.5 MG/3ML) 0.083% nebulizer solution Take 3 mLs (2.5 mg total) by nebulization every 4 (four) hours as needed for wheezing or shortness of breath. 03/25/18  Yes Nyoka Cowden, MD  albuterol (VENTOLIN HFA) 108 (90 Base) MCG/ACT inhaler INHALE 2 PUFFS INTO THE LUNGS EVERY 6 HOURS AS NEEDED FOR WHEEZING OR SHORTNESS OF BREATH 05/17/21  Yes Nyoka Cowden, MD  azelastine (ASTELIN) 0.1 % nasal spray Place 2 sprays into both nostrils at bedtime. Use in each nostril as directed 03/27/21 08/12/21  Yes Alfonse Spruce, MD  budesonide-formoterol (SYMBICORT) 160-4.5 MCG/ACT inhaler INHALE 2 PUFFS INTO THE LUNGS FIRST THING IN THE MORNING THEN INHALE 2 PUFFS BY MOUTH 12 HOURS LATER Patient taking differently: Inhale 2 puffs into the lungs every 12 (twelve) hours. 03/18/21  Yes Nyoka Cowden, MD  fluticasone (FLONASE) 50 MCG/ACT nasal spray Place 2 sprays into both nostrils at bedtime. 05/31/21 08/12/21 Yes Alfonse Spruce, MD  Homeopathic Products ([facility]) GEL Apply topically.   Yes [provider]  Ibuprofen 200 MG CAPS Take 200-400 mg by mouth every 8 (eight) hours as needed.   Yes [provider]  levocetirizine (XYZAL) 5 MG tablet Take 1 tablet (5 mg total) by mouth 2 (two) times daily as needed (Can take an extra dose during flare ups.). Patient taking differently: Take 5 mg by mouth daily as needed (Can take an extra dose during flare ups.). 05/31/21  Yes Alfonse Spruce, MD  Loperamide HCl (IMODIUM PO) Take by mouth. As needed   Yes [provider]  Multiple Vitamin (MULTIVITAMIN) tablet Take 1 tablet by mouth every evening.    Yes [provider]  omeprazole (PRILOSEC) 20 MG capsule Take 20 mg by mouth daily.   Yes [provider]  potassium chloride 20 MEQ/15ML (10%) SOLN TAKE 15 MLS BY MOUTH EVERY DAY. 07/17/21  Yes Paseda, Baird Kay, FNP  telmisartan (MICARDIS) 40 MG tablet Take 1 tablet (40 mg total) by mouth daily.  11/07/20  Yes Nyoka Cowden, MD  Calcium Carb-Cholecalciferol (CALTRATE 600+D3) 600-20 MG-MCG TABS Take 600 mg by mouth 2 (two) times daily. 07/06/21   Donell Beers, FNP      Allergies    Epinephrine    Review of Systems   Review of Systems  Gastrointestinal:  Positive for abdominal pain.  All other systems reviewed and are negative.   Physical Exam Updated Vital Signs BP 90/64   Pulse 94   Temp 98.2 F (36.8 C) (Oral)   Resp 11   Ht 1.676 m (5\' 6" )   Wt 49 kg   SpO2 94%   BMI 17.43  kg/m  Physical Exam Vitals and nursing note reviewed.  Constitutional:      General: He is not in acute distress.    Appearance: He is well-developed.  HENT:     Head: Normocephalic and atraumatic.     Mouth/Throat:     Mouth: Mucous membranes are dry.     Pharynx: No oropharyngeal exudate.     Comments: Dry mucous membranes with thrush present on the palate as well as on the lips Eyes:     General: Scleral icterus present.        Right eye: No discharge.        Left eye: No discharge.     Pupils: Pupils are equal, round, and reactive to light.  Neck:     Thyroid: No thyromegaly.     Vascular: No JVD.  Cardiovascular:     Rate and Rhythm: Regular rhythm. Tachycardia present.     Heart sounds: Normal heart sounds. No murmur heard.    No friction rub. No gallop.  Pulmonary:     Effort: Pulmonary effort is normal. No respiratory distress.     Breath sounds: Normal breath sounds. No wheezing or rales.  Abdominal:     General: Bowel sounds are normal. There is no distension.     Palpations: Abdomen is soft. There is no mass.     Tenderness: There is abdominal tenderness.     Comments: Diffuse mild abdominal tenderness to palpation with mild guarding diffusely, no peritoneal signs  Musculoskeletal:        General: No tenderness. Normal range of motion.     Cervical back: Normal range of motion and neck supple.     Right lower leg: No edema.     Left lower leg: No edema.  Lymphadenopathy:     Cervical: No cervical adenopathy.  Skin:    General: Skin is warm and dry.     Findings: No erythema or rash.  Neurological:     General: No focal deficit present.     Mental Status: He is alert.     Coordination: Coordination normal.     Comments: Generally weak but no focal abnormalities on his exam otherwise  Psychiatric:        Behavior: Behavior normal.     ED Results / Procedures / Treatments   Labs (all labs ordered are listed, but only abnormal results are displayed) Labs  Reviewed  COMPREHENSIVE METABOLIC PANEL - Abnormal; Notable for the following components:      Result Value   Sodium 130 (*)    Potassium 3.3 (*)    Chloride 92 (*)    Glucose, Bld 101 (*)    Creatinine, Ser 3.97 (*)    Calcium 7.3 (*)    Total Protein 6.0 (*)    Albumin 2.2 (*)    AST 187 (*)  ALT 74 (*)    Alkaline Phosphatase 254 (*)    Total Bilirubin 15.6 (*)    GFR, Estimated 18 (*)    Anion gap 16 (*)    All other components within normal limits  CBC - Abnormal; Notable for the following components:   WBC 12.5 (*)    RBC 2.94 (*)    Hemoglobin 11.7 (*)    HCT 34.7 (*)    MCV 118.0 (*)    MCH 39.8 (*)    All other components within normal limits  ACETAMINOPHEN LEVEL - Abnormal; Notable for the following components:   Acetaminophen (Tylenol), Serum <10 (*)    All other components within normal limits  PROTIME-INR - Abnormal; Notable for the following components:   Prothrombin Time 16.3 (*)    INR 1.3 (*)    All other components within normal limits  GASTROINTESTINAL PANEL BY PCR, STOOL (REPLACES STOOL CULTURE)  C DIFFICILE QUICK SCREEN W PCR REFLEX    LIPASE, BLOOD  SALICYLATE LEVEL  ETHANOL  URINALYSIS, ROUTINE W REFLEX MICROSCOPIC  RAPID HIV SCREEN (HIV 1/2 AB+AG)  HEPATITIS PANEL, ACUTE    EKG None  Radiology CT ABDOMEN PELVIS WO CONTRAST  Result Date: 08/12/2021 CLINICAL DATA:  A 46 year old male presents for evaluation of nausea vomiting and abdominal pain. EXAM: CT ABDOMEN AND PELVIS WITHOUT CONTRAST TECHNIQUE: Multidetector CT imaging of the abdomen and pelvis was performed following the standard protocol without IV contrast. RADIATION DOSE REDUCTION: This exam was performed according to the departmental dose-optimization program which includes automated exposure control, adjustment of the mA and/or kV according to patient size and/or use of iterative reconstruction technique. COMPARISON:  Ultrasound evaluation from January 09, 2021. FINDINGS: Lower  chest: No effusion. No consolidative changes. Heart size normal. Low-attenuation in cardiac chambers compatible with anemia. Pulmonary emphysema. Hepatobiliary: Severe hepatic steatosis. Density of the liver is less than the density of blood pool. Small perihepatic ascites. The no pericholecystic stranding. Biliary tree without gross dilation but not well assessed due to the density of the liver. Pancreas: Pancreatic atrophy without signs of inflammation or gross ductal dilation. Spleen: Normal. Adrenals/Urinary Tract: Adrenal glands are normal. Mild perinephric stranding. No hydronephrosis. Urinary bladder is collapsed. No nephrolithiasis. Stomach/Bowel: The stomach is under distended. No adjacent stranding. No small bowel dilation. The appendix is normal. Mural stratification of the colon.  Colon under distended. Vascular/Lymphatic: Aortic atherosclerosis without aneurysm. There is no gastrohepatic or hepatoduodenal ligament lymphadenopathy. No retroperitoneal or mesenteric lymphadenopathy. No pelvic sidewall lymphadenopathy. Reproductive: Unremarkable by CT. Other: No pneumoperitoneum.  Small volume ascites in the pelvis. Subtle stranding in the a omentum in the lower abdomen. This is not clear whether this represents ascites omits omental fat or secondary inflammation in the lower abdomen without clear cause. Musculoskeletal: No acute bone finding. No destructive bone process. Spinal degenerative changes. No acute or destructive bone finding. Osteopenia. IMPRESSION: 1. Severe hepatic steatosis and ascites. Findings likely related to hepatic dysfunction. No current signs of portal hypertension. 2. Stranding in the omental and lower abdominal fat may relate to scattered areas of ascites though underlying inflammation is considered but without clear cause. 3. Under distended colon with suggestion of mural stratification. This could be related to mild colitis and should be correlated with any clinical signs of  colitis perhaps this could explain some background peritoneal inflammation. Would also consider the possibility of SBP though the patient has no overt signs of portal hypertension despite having clear evidence of severe hepatic steatosis and ascites. 4.  Normal appendix. 5. Pulmonary emphysema and aortic atherosclerosis. Aortic Atherosclerosis (ICD10-I70.0) and Emphysema (ICD10-J43.9). Electronically Signed   By: Donzetta Kohut M.D.   On: 08/12/2021 17:09    Procedures .Critical Care  Performed by: Eber Hong, MD Authorized by: Eber Hong, MD   Critical care provider statement:    Critical care time (minutes):  45   Critical care time was exclusive of:  Separately billable procedures and treating other patients and teaching time   Critical care was necessary to treat or prevent imminent or life-threatening deterioration of the following conditions:  Hepatic failure, renal failure and shock   Critical care was time spent personally by me on the following activities:  Development of treatment plan with patient or surrogate, discussions with consultants, evaluation of patient's response to treatment, examination of patient, obtaining history from patient or surrogate, review of old charts, re-evaluation of patient's condition, pulse oximetry, ordering and review of radiographic studies, ordering and review of laboratory studies and ordering and performing treatments and interventions   I assumed direction of critical care for this patient from another provider in my specialty: no     Care discussed with: admitting provider   Comments:      Gust with both Dr. Marletta Lor of the GI service as well as the hospitalist      Medications Ordered in ED Medications  loperamide (IMODIUM) capsule 4 mg (4 mg Oral Patient Refused/Not Given 08/12/21 1744)  piperacillin-tazobactam (ZOSYN) IVPB 3.375 g (has no administration in time range)  albumin human 25 % solution 75 g (has no administration in time range)   sodium chloride 0.9 % bolus 1,000 mL (0 mLs Intravenous Stopped 08/12/21 1852)  ondansetron (ZOFRAN) injection 4 mg (4 mg Intravenous Given 08/12/21 1629)  HYDROmorphone (DILAUDID) injection 1 mg (1 mg Intravenous Given 08/12/21 1754)    ED Course/ Medical Decision Making/ A&P                           Medical Decision Making Amount and/or Complexity of Data Reviewed Labs: ordered.  Risk Prescription drug management. Decision regarding hospitalization.   This patient presents to the ED for concern of abdominal pain with nausea vomiting diarrhea, this involves an extensive number of treatment options, and is a complaint that carries with it a high risk of complications and morbidity.  The differential diagnosis includes acute liver failure, renal failure, hepatorenal syndrome, this could be acute hepatitis, he could be immunosuppressed given his thrush but he is on inhaled steroids chronically.  The patient has potential for other surgical causes such as obstructive biliary lesions and will need a CT scan of the abdomen and pelvis and admission to the hospital   Co morbidities that complicate the patient evaluation  Chronic mild liver problems over the last year currently in process of ongoing evaluation and diagnosis uncertainty.  He has liver failure at this time, renal failure at this time, he is severely dehydrated.   Additional history obtained:  Additional history obtained from electronic medical record External records from outside source obtained and reviewed including prior ultrasound and GI work-ups in the office   Lab Tests:  I Ordered, and personally interpreted labs.  The pertinent results include: Stool panels, labs, CBC, metabolic panel, liver function, the patient is in florid renal failure, liver failure, bilirubin of 15, urinalysis   Imaging Studies ordered:  I ordered imaging studies including CT scan of the abdomen and pelvis without contrast I independently  visualized and interpreted imaging which showed some ascites, what appears to be liver failure, no other causes of acute abdominal pain I agree with the radiologist interpretation   Cardiac Monitoring: / EKG:  The patient was maintained on a cardiac monitor.  I personally viewed and interpreted the cardiac monitored which showed an underlying rhythm of: Mild tachycardia improved with IV fluid   Consultations Obtained:  I requested consultation with the gastroenterologist Dr. Marletta Lor who recommends supportive care, albumin and to treat presumptively for SBP,  and discussed lab and imaging findings as well as pertinent plan - they recommend: Admission to the hospital based on hospitalist recommendations as well.  Whether the patient stays here or goes to higher level of care will be up to hospitalist   Problem List / ED Course / Critical interventions / Medication management  I did an ultrasound of the patient's abdomen and saw no obvious large fluid pockets that would be safe for me to do by paracentesis, he will likely need to have an interventional radiology procedure done at a later time, that being said Dr. Marletta Lor recommended Zosyn which I have ordered.  Additionally he recommended albumin which has been ordered and admission to the hospital.  The patient is suffering from multiorgan system failure from both his liver and his kidney, he does have ongoing slight hypotension with a blood pressure of 90/64, is getting albumin, fluids and antibiotics at this time.  He has no fever and no significant leukocytosis or other source of infection other than potentially SBP I ordered medication including antibiotics and IV fluids for hypotension and potential SBP Reevaluation of the patient after these medicines showed that the patient improved I have reviewed the patients home medicines and have made adjustments as needed   Social Determinants of Health:  Patient has multiorgan system failure, very  critically ill-appearing   Test / Admission - Considered:  Critically ill-appearing, needs admission to high level of care to the hospital         Final Clinical Impression(s) / ED Diagnoses Final diagnoses:  Acute liver failure without hepatic coma  Acute renal failure, unspecified acute renal failure type (HCC)     Eber Hong, MD 08/12/21 1939

## 2021-08-12 NOTE — Assessment & Plan Note (Signed)
-  currently holding telmisartan in the setting of soft pressure and AKI -Will add PRN BP medication if BP starts trending up -BP currently 90/60 -continue to monitor

## 2021-08-12 NOTE — Progress Notes (Addendum)
GI service contacted by Dr. Hyacinth Meeker in regards to this patient. In short he is a 28 male with history of alcohol abuse prior, currently being worked up for chronically elevated LFTs.  Chronic pattern of AST more elevated than ALT. History of ETOH use in the past but now reportedly just on occasion. Known fatty liver. Hep C ab negative, Hep B surface antibody positive consistent with immunmity. Hep B surface antigen negative. US abdomen with fatty liver in Nov 2022. IgM mildly elevated. IgA elevated, IgG normal. ANA, ASMA, AMA negative. Iron sats 85, ferritin elevated at 1351 but DNA mutation not seen for hemochromatosis. Ceruloplasmin was below 20, and urine copper was low.  Recommended liver biopsy previously though patient decided against this.  Presented to [facility] ER today feeling unwell. Blood work showed significant worsening of T bili to 15.6 (from 1.6 on 07/23/21), Alk phos 254, AST 187, ALT 74, Albumin 2.2. Also with new renal failure with Cr 3.97 (baseline normal), INR 1.3.  Tylenol level WNL, salicylate level WNL, ethanol level WNL.  CT abdomen pelvis showed severe hepatic steatosis and ascites, no evidence of portal hypertension.  Also showed stranding in the omentum and lower abdominal fat which could relate to ascites though underlying inflammation considered.  Also possible mild colitis.  Patient presenting with acute liver injury complicated by new ascites and  new onset renal failure.  Etiology unclear.  Given his history as well as pattern of aminotransferases, concern for severe alcoholic hepatitis with possible SBP as well given his renal failure and leukocytosis.  Other work-up has been unremarkable.  Patient does actively deny any chronic alcohol use.  Ethanol level is WNL.  Agree with IV antibiotics with Zosyn.    Would also recommend albumin infusion of 1.5g/kg.    Dr. Hyacinth Meeker to try to obtain diagnostic tap though will likely be difficult given small amount of ascites.     Recommend ultrasound with Doppler in the a.m. as well as paracentesis by radiology if able. Needs SAAG calculation after fluid obtained.   Fortunately, patient without encephalopathy or significant coagulopathy.  Would watch closely with low threshold to transfer to liver transplant center if condition deteriorates.    May need to also consider liver biopsy pending clinical course.    Consider MRI/MRCP to rule out underlying PSC given his acute presentation and possible colitis, though this is likely a consequence of his hypoalbuminemia.   Will check alcohol urine metabolite.  Formal consult by GI in the AM.

## 2021-08-13 ENCOUNTER — Inpatient Hospital Stay (HOSPITAL_COMMUNITY): Payer: Federal, State, Local not specified - PPO

## 2021-08-13 ENCOUNTER — Ambulatory Visit (HOSPITAL_COMMUNITY): Payer: Federal, State, Local not specified - PPO

## 2021-08-13 DIAGNOSIS — R0602 Shortness of breath: Secondary | ICD-10-CM | POA: Diagnosis not present

## 2021-08-13 DIAGNOSIS — I959 Hypotension, unspecified: Secondary | ICD-10-CM | POA: Diagnosis not present

## 2021-08-13 DIAGNOSIS — E872 Acidosis, unspecified: Secondary | ICD-10-CM | POA: Diagnosis not present

## 2021-08-13 DIAGNOSIS — Z66 Do not resuscitate: Secondary | ICD-10-CM | POA: Diagnosis not present

## 2021-08-13 DIAGNOSIS — R7401 Elevation of levels of liver transaminase levels: Secondary | ICD-10-CM

## 2021-08-13 DIAGNOSIS — R748 Abnormal levels of other serum enzymes: Secondary | ICD-10-CM | POA: Diagnosis present

## 2021-08-13 DIAGNOSIS — D689 Coagulation defect, unspecified: Secondary | ICD-10-CM

## 2021-08-13 DIAGNOSIS — E7220 Disorder of urea cycle metabolism, unspecified: Secondary | ICD-10-CM | POA: Diagnosis present

## 2021-08-13 DIAGNOSIS — R791 Abnormal coagulation profile: Secondary | ICD-10-CM

## 2021-08-13 DIAGNOSIS — R627 Adult failure to thrive: Secondary | ICD-10-CM | POA: Diagnosis present

## 2021-08-13 DIAGNOSIS — K769 Liver disease, unspecified: Secondary | ICD-10-CM | POA: Diagnosis not present

## 2021-08-13 DIAGNOSIS — K920 Hematemesis: Secondary | ICD-10-CM | POA: Diagnosis present

## 2021-08-13 DIAGNOSIS — K7290 Hepatic failure, unspecified without coma: Secondary | ICD-10-CM | POA: Diagnosis not present

## 2021-08-13 DIAGNOSIS — D649 Anemia, unspecified: Secondary | ICD-10-CM

## 2021-08-13 DIAGNOSIS — R1084 Generalized abdominal pain: Secondary | ICD-10-CM | POA: Diagnosis not present

## 2021-08-13 DIAGNOSIS — R140 Abdominal distension (gaseous): Secondary | ICD-10-CM

## 2021-08-13 DIAGNOSIS — N179 Acute kidney failure, unspecified: Secondary | ICD-10-CM | POA: Diagnosis not present

## 2021-08-13 DIAGNOSIS — R7989 Other specified abnormal findings of blood chemistry: Secondary | ICD-10-CM | POA: Diagnosis not present

## 2021-08-13 DIAGNOSIS — E778 Other disorders of glycoprotein metabolism: Secondary | ICD-10-CM | POA: Diagnosis present

## 2021-08-13 DIAGNOSIS — Z515 Encounter for palliative care: Secondary | ICD-10-CM | POA: Diagnosis not present

## 2021-08-13 DIAGNOSIS — K7200 Acute and subacute hepatic failure without coma: Secondary | ICD-10-CM | POA: Diagnosis not present

## 2021-08-13 DIAGNOSIS — K922 Gastrointestinal hemorrhage, unspecified: Secondary | ICD-10-CM | POA: Diagnosis not present

## 2021-08-13 DIAGNOSIS — R188 Other ascites: Secondary | ICD-10-CM | POA: Diagnosis not present

## 2021-08-13 DIAGNOSIS — I10 Essential (primary) hypertension: Secondary | ICD-10-CM | POA: Diagnosis not present

## 2021-08-13 DIAGNOSIS — K767 Hepatorenal syndrome: Secondary | ICD-10-CM | POA: Diagnosis present

## 2021-08-13 DIAGNOSIS — E162 Hypoglycemia, unspecified: Secondary | ICD-10-CM | POA: Diagnosis present

## 2021-08-13 DIAGNOSIS — K7689 Other specified diseases of liver: Secondary | ICD-10-CM | POA: Diagnosis present

## 2021-08-13 DIAGNOSIS — I81 Portal vein thrombosis: Secondary | ICD-10-CM | POA: Diagnosis not present

## 2021-08-13 LAB — CBC WITH DIFFERENTIAL/PLATELET
Basophils Absolute: 0 10*3/uL (ref 0.0–0.1)
Basophils Relative: 0 %
Eosinophils Absolute: 0 10*3/uL (ref 0.0–0.5)
Eosinophils Relative: 0 %
HCT: 24.4 % — ABNORMAL LOW (ref 39.0–52.0)
Hemoglobin: 8.1 g/dL — ABNORMAL LOW (ref 13.0–17.0)
Lymphocytes Relative: 14 %
Lymphs Abs: 0.7 10*3/uL (ref 0.7–4.0)
MCH: 40.5 pg — ABNORMAL HIGH (ref 26.0–34.0)
MCHC: 33.2 g/dL (ref 30.0–36.0)
MCV: 122 fL — ABNORMAL HIGH (ref 80.0–100.0)
Monocytes Absolute: 0.2 10*3/uL (ref 0.1–1.0)
Monocytes Relative: 3 %
Neutro Abs: 4.2 10*3/uL (ref 1.7–7.7)
Neutrophils Relative %: 83 %
Platelets: 141 10*3/uL — ABNORMAL LOW (ref 150–400)
RBC: 2 MIL/uL — ABNORMAL LOW (ref 4.22–5.81)
RDW: 12.6 % (ref 11.5–15.5)
WBC Morphology: REACTIVE
WBC: 5.1 10*3/uL (ref 4.0–10.5)

## 2021-08-13 LAB — GLUCOSE, CAPILLARY
Glucose-Capillary: 51 mg/dL — ABNORMAL LOW (ref 70–99)
Glucose-Capillary: 105 mg/dL — ABNORMAL HIGH (ref 70–99)
Glucose-Capillary: 67 mg/dL — ABNORMAL LOW (ref 70–99)

## 2021-08-13 LAB — LACTIC ACID, PLASMA
Lactic Acid, Venous: 6.4 mmol/L (ref 0.5–1.9)
Lactic Acid, Venous: 5.7 mmol/L (ref 0.5–1.9)

## 2021-08-13 LAB — COMPREHENSIVE METABOLIC PANEL
ALT: 40 U/L (ref 0–44)
AST: 150 U/L — ABNORMAL HIGH (ref 15–41)
Albumin: 3.3 g/dL — ABNORMAL LOW (ref 3.5–5.0)
Alkaline Phosphatase: 138 U/L — ABNORMAL HIGH (ref 38–126)
Anion gap: 19 — ABNORMAL HIGH (ref 5–15)
BUN: 18 mg/dL (ref 6–20)
CO2: 17 mmol/L — ABNORMAL LOW (ref 22–32)
Calcium: 6.7 mg/dL — ABNORMAL LOW (ref 8.9–10.3)
Chloride: 97 mmol/L — ABNORMAL LOW (ref 98–111)
Creatinine, Ser: 4.19 mg/dL — ABNORMAL HIGH (ref 0.61–1.24)
GFR, Estimated: 17 mL/min — ABNORMAL LOW (ref >60)
Glucose, Bld: 51 mg/dL — ABNORMAL LOW (ref 70–99)
Potassium: 3.3 mmol/L — ABNORMAL LOW (ref 3.5–5.1)
Sodium: 133 mmol/L — ABNORMAL LOW (ref 135–145)
Total Bilirubin: 11.2 mg/dL — ABNORMAL HIGH (ref 0.3–1.2)
Total Protein: 5.4 g/dL — ABNORMAL LOW (ref 6.5–8.1)

## 2021-08-13 LAB — BLOOD GAS, ARTERIAL
Acid-base deficit: 4.8 mmol/L — ABNORMAL HIGH (ref 0.0–2.0)
Bicarbonate: 19.7 mmol/L — ABNORMAL LOW (ref 20.0–28.0)
Drawn by: 35043
FIO2: 36 %
O2 Saturation: 99 %
Patient temperature: 37
pCO2 arterial: 34 mmHg (ref 32–48)
pH, Arterial: 7.37 (ref 7.35–7.45)
pO2, Arterial: 86 mmHg (ref 83–108)

## 2021-08-13 LAB — HEPATITIS PANEL, ACUTE
HCV Ab: NONREACTIVE
Hep A IgM: NONREACTIVE
Hep B C IgM: NONREACTIVE
Hepatitis B Surface Ag: NONREACTIVE

## 2021-08-13 LAB — MAGNESIUM: Magnesium: 1.2 mg/dL — ABNORMAL LOW (ref 1.7–2.4)

## 2021-08-13 LAB — ABO/RH: ABO/RH(D): O POS

## 2021-08-13 LAB — PREPARE RBC (CROSSMATCH)

## 2021-08-13 LAB — AMMONIA: Ammonia: 165 umol/L — ABNORMAL HIGH (ref 9–35)

## 2021-08-13 LAB — PROTIME-INR
INR: 2 — ABNORMAL HIGH (ref 0.8–1.2)
Prothrombin Time: 22.8 seconds — ABNORMAL HIGH (ref 11.4–15.2)

## 2021-08-13 LAB — OCCULT BLOOD GASTRIC / DUODENUM (SPECIMEN CUP)
Occult Blood, Gastric: POSITIVE — AB
pH, Gastric: 4

## 2021-08-13 MED ORDER — ATROPINE SULFATE 1 % OP SOLN: 4.0000 [drp] | OPHTHALMIC | Status: DC | PRN

## 2021-08-13 MED ORDER — MAGNESIUM SULFATE 4 GM/100ML IV SOLN
4.0000 g | Freq: Once | INTRAVENOUS | Status: AC
  Administered 2021-08-13: 4 g via INTRAVENOUS
  Filled 2021-08-13: qty 100

## 2021-08-13 MED ORDER — DEXTROSE 50 % IV SOLN
INTRAVENOUS | Status: AC
  Administered 2021-08-13: 50 mL
  Filled 2021-08-13: qty 50

## 2021-08-13 MED ORDER — SODIUM CHLORIDE 0.9% IV SOLUTION: Freq: Once | INTRAVENOUS | Status: DC

## 2021-08-13 MED ORDER — POLYVINYL ALCOHOL 1.4 % OP SOLN: 1.0000 [drp] | Freq: Four times a day (QID) | OPHTHALMIC | Status: DC | PRN

## 2021-08-13 MED ORDER — NICOTINE 14 MG/24HR TD PT24
14.0000 mg | MEDICATED_PATCH | Freq: Every day | TRANSDERMAL | Status: DC
  Filled 2021-08-13: qty 1

## 2021-08-13 MED ORDER — ETOMIDATE 2 MG/ML IV SOLN
INTRAVENOUS | Status: AC
  Filled 2021-08-13: qty 10

## 2021-08-13 MED ORDER — PIPERACILLIN-TAZOBACTAM 3.375 G IVPB
3.3750 g | Freq: Two times a day (BID) | INTRAVENOUS | Status: DC
  Administered 2021-08-13: 3.375 g via INTRAVENOUS
  Filled 2021-08-13: qty 50

## 2021-08-13 MED ORDER — FUROSEMIDE 10 MG/ML IJ SOLN
40.0000 mg | Freq: Once | INTRAMUSCULAR | Status: AC
  Administered 2021-08-13: 40 mg via INTRAVENOUS
  Filled 2021-08-13: qty 4

## 2021-08-13 MED ORDER — KCL IN DEXTROSE-NACL 20-5-0.9 MEQ/L-%-% IV SOLN: INTRAVENOUS | Status: DC

## 2021-08-13 MED ORDER — CHLORHEXIDINE GLUCONATE CLOTH 2 % EX PADS
6.0000 | MEDICATED_PAD | Freq: Every day | CUTANEOUS | Status: DC
  Administered 2021-08-13: 6 via TOPICAL

## 2021-08-13 MED ORDER — PROCHLORPERAZINE EDISYLATE 10 MG/2ML IJ SOLN
10.0000 mg | Freq: Four times a day (QID) | INTRAMUSCULAR | Status: DC | PRN
  Administered 2021-08-13: 10 mg via INTRAVENOUS
  Filled 2021-08-13: qty 2

## 2021-08-13 MED ORDER — HYDROMORPHONE HCL 1 MG/ML IJ SOLN
0.5000 mg | INTRAMUSCULAR | Status: DC | PRN
  Administered 2021-08-13: 0.5 mg via INTRAVENOUS
  Filled 2021-08-13: qty 0.5

## 2021-08-13 MED ORDER — SUCCINYLCHOLINE CHLORIDE 200 MG/10ML IV SOSY
PREFILLED_SYRINGE | INTRAVENOUS | Status: DC
Start: 2021-08-13 — End: 2021-08-13
  Filled 2021-08-13: qty 10

## 2021-08-13 MED ORDER — VITAMIN K1 10 MG/ML IJ SOLN
10.0000 mg | Freq: Once | INTRAMUSCULAR | Status: DC
  Filled 2021-08-13: qty 1

## 2021-08-13 MED ORDER — SODIUM CHLORIDE 0.9 % IV BOLUS
1000.0000 mL | Freq: Once | INTRAVENOUS | Status: AC
  Administered 2021-08-13: 1000 mL via INTRAVENOUS

## 2021-08-14 LAB — HEPATITIS B DNA, ULTRAQUANTITATIVE, PCR
HBV DNA SERPL PCR-ACNC: NOT DETECTED IU/mL
HBV DNA SERPL PCR-LOG IU: UNDETERMINED log10 IU/mL

## 2021-08-14 LAB — EBV AB TO VIRAL CAPSID AG PNL, IGG+IGM
EBV VCA IgG: <18 U/mL (ref 0.0–17.9)
EBV VCA IgM: <36 U/mL (ref 0.0–35.9)

## 2021-08-14 LAB — HSV(HERPES SIMPLEX VRS) I + II AB-IGG
HSV 1 Glycoprotein G Ab, IgG: 14.5 index — ABNORMAL HIGH (ref 0.00–0.90)
HSV 2 Glycoprotein G Ab, IgG: <0.91 index (ref 0.00–0.90)

## 2021-08-14 LAB — HCV RNA QUANT: HCV Quantitative: NOT DETECTED IU/mL (ref >50)

## 2021-08-14 LAB — CMV IGM: CMV IgM: <30 AU/mL (ref 0.0–29.9)

## 2021-08-14 LAB — H. PYLORI ANTIBODY, IGG: H Pylori IgG: 0.11 Index Value (ref 0.00–0.79)

## 2021-08-15 ENCOUNTER — Ambulatory Visit (HOSPITAL_COMMUNITY): Payer: Federal, State, Local not specified - PPO | Admitting: Physician Assistant

## 2021-08-15 LAB — TYPE AND SCREEN
ABO/RH(D): O POS
Antibody Screen: NEGATIVE
Unit division: 0

## 2021-08-15 LAB — BPAM RBC
Blood Product Expiration Date: 202307272359
ISSUE DATE / TIME: 202306281030
Unit Type and Rh: 5100

## 2021-08-15 MED FILL — Medication: Qty: 1 | Status: AC

## 2021-08-17 LAB — CULTURE, BLOOD (ROUTINE X 2)
Culture: NO GROWTH
Culture: NO GROWTH
Special Requests: ADEQUATE
Special Requests: ADEQUATE

## 2021-08-17 NOTE — Progress Notes (Signed)
Patient arrived to ICU. Blood glucose checked: 51. 1 amp of D50 given per standing orders. New order placed for fluids with dextrose.

## 2021-08-17 NOTE — Progress Notes (Addendum)
Pt began  to desat to 77 on Room air. C/o shortness of breath and "not getting enough air". Pt was placed on 2 L [place] in which he still remained in low 80's. Oxygen increased to 4 L [place]. Pt SPO2 at 92%. Respiratory Rhonchi and Expiratory wheeze noted on Right and Expiratory wheezes noted on left. JVD noted. Pt is antsy. Dr. Dorthula Perfect- Delrae Rend informed and was requested to bedside. Charge Forensic psychologist at bedside. Per Dr. Dorthula Perfect ABG and STAT CXR ordered. Wardell Heath Gerrianne Scale

## 2021-08-17 NOTE — Consult Note (Addendum)
@LOGO @   Referring Provider: [facility] hospital[facility] Primary Care Physician:  Donell Beers, FNP Primary Gastroenterologist:  Dr. Marletta Lor  Date of Admission: 08/12/2021 Date of Consultation: Sep 11, 2021  Reason for Consultation: Transaminitis, hyperbilirubinemia  HPI:  Ethan Norton is a 46 y.o. year old male with medical history significant of anxiety, COPD, depression, hypertension, alcohol use, RA, who presented to the ED on 6/26 with chief complaint of abdominal pain x1 week that was diffuse, but worse in the periumbilical region wrapping around to his left back, nausea, vomiting, diarrhea.   ED course: BPs were soft in 90s over 60s range, intermittently tachycardic up to 105, tachypneic up to 26, saturating 96% on room air, afebrile. WBC 12.5 (H), hemoglobin 11.7 (L), MCV 118 (H), MCH 39.9 (H), platelets 171.  INR 1.3 (H) Lipase normal. Acute hepatitis panel negative. Acetaminophen level less than 10. Salicylate level 10.6. Alcohol less than 10. Stool studies ordered. Blood cultures ordered.  CT A/P without contrast with severe hepatic steatosis and ascites, no signs of portal hypertension, stranding of the omental and lower abdominal fat possibly secondary to ascites though underlying inflammation is considered, under distended colon with suggestion of mural stratification possibly secondary to mild colitis versus possible SBP, normal appendix, pulmonary emphysema, aortic atherosclerosis.  He received 1 L bolus of fluid in the ED, started on Zosyn for possible SBP, given albumin infusion of 75g.  Dr. Hyacinth Meeker was unable to perform bedside diagnostic tap given small amount of ascites.  Today:  Earlier this morning, patient began to desaturate down to 77% on room air, ultimately requiring 4 L nasal cannula.  Chest x-ray was unrevealing.  He was given 40 mg of Lasix, but no urinary output.  Bladder scan showed 380, but indwelling catheter output of only 25.  Catheter left in  place.  Around 730 this morning, patient was restless, tachypneic, short of breath.  Nurse noted JVD, heart rate 118-125.  Patient was complaining of severe abdominal pain.  Noted his abdomen was distended and he had hypoactive bowel sounds.  He was transferred to ICU.  LFTs have improved today with alk phos 138, AST 150, ALT 40, T. bili down to 11.2.  Creatinine up to 4.19. INR 2.0, but he is receiving heparin. Hemoglobin down to 8.1, platelets 141. Magnesium 1.2. Gastric occult blood positive.  Consult:  Very difficult historian today. Uncomfortable laying in bed, sitting up on the side, history is inconsistent.   Generalized abdominal pain x 1 week. Associated intermittent nausea and 1-2 episodes of vomiting. Chronic issues with vomiting, about 5 times a month with associated generalized abdominal pain that last a couple days and self resolves. Denies hematemesis. Had diarrhea 4-5 days ago with 1-2 Bms a day. Last BM was 4-5 days ago. Has passed gas today. No brbpr or melena. Feels like he has to has to have a BM now.   Gagging/coughing while I am in the room. Small amount dark red blood noted in emesis bag.   Has worsening abdominal pain today. 9/10 in severity. Abdomen is distended.    ETOH: No alcohol in 18 months.   No tylenol.  New medications/recent antibiotics: No recent antibiotics. Started an inhaler recently.  Recent viral illness/sick contacts:  None. Reports he had a similar   Some report of possible coffee ground emesis vs hemoptysis overnight from nursing staff.   Hypotensive 80s/50s, tachycardic, tachypnic, hypoactive bowel sounds, distended and tight,    Past Medical History:  Diagnosis Date   Anxiety  COPD (chronic obstructive pulmonary disease) (HCC)    Depression    Hypertension    Neuropathic pain    RA (rheumatoid arthritis) (HCC)     Past Surgical History:  Procedure Laterality Date   WISDOM TOOTH EXTRACTION      Prior to Admission medications    Medication Sig Start Date End Date Taking? Authorizing Provider  albuterol (PROVENTIL) (2.5 MG/3ML) 0.083% nebulizer solution Take 3 mLs (2.5 mg total) by nebulization every 4 (four) hours as needed for wheezing or shortness of breath. 03/25/18  Yes Nyoka Cowden, MD  albuterol (VENTOLIN HFA) 108 (90 Base) MCG/ACT inhaler INHALE 2 PUFFS INTO THE LUNGS EVERY 6 HOURS AS NEEDED FOR WHEEZING OR SHORTNESS OF BREATH 05/17/21  Yes Nyoka Cowden, MD  azelastine (ASTELIN) 0.1 % nasal spray Place 2 sprays into both nostrils at bedtime. Use in each nostril as directed 03/27/21 08/12/21 Yes Alfonse Spruce, MD  budesonide-formoterol (SYMBICORT) 160-4.5 MCG/ACT inhaler INHALE 2 PUFFS INTO THE LUNGS FIRST THING IN THE MORNING THEN INHALE 2 PUFFS BY MOUTH 12 HOURS LATER Patient taking differently: Inhale 2 puffs into the lungs every 12 (twelve) hours. 03/18/21  Yes Nyoka Cowden, MD  fluticasone (FLONASE) 50 MCG/ACT nasal spray Place 2 sprays into both nostrils at bedtime. 05/31/21 08/12/21 Yes Alfonse Spruce, MD  Homeopathic Products ([facility]) GEL Apply topically.   Yes [provider]  Ibuprofen 200 MG CAPS Take 200-400 mg by mouth every 8 (eight) hours as needed.   Yes [provider]  levocetirizine (XYZAL) 5 MG tablet Take 1 tablet (5 mg total) by mouth 2 (two) times daily as needed (Can take an extra dose during flare ups.). Patient taking differently: Take 5 mg by mouth daily as needed (Can take an extra dose during flare ups.). 05/31/21  Yes Alfonse Spruce, MD  Loperamide HCl (IMODIUM PO) Take by mouth. As needed   Yes [provider]  Multiple Vitamin (MULTIVITAMIN) tablet Take 1 tablet by mouth every evening.    Yes [provider]  omeprazole (PRILOSEC) 20 MG capsule Take 20 mg by mouth daily.   Yes [provider]  potassium chloride 20 MEQ/15ML (10%) SOLN TAKE 15 MLS BY MOUTH EVERY DAY. 07/17/21  Yes Paseda, Baird Kay, FNP  telmisartan  (MICARDIS) 40 MG tablet Take 1 tablet (40 mg total) by mouth daily. 11/07/20  Yes Nyoka Cowden, MD  Calcium Carb-Cholecalciferol (CALTRATE 600+D3) 600-20 MG-MCG TABS Take 600 mg by mouth 2 (two) times daily. 07/06/21   Donell Beers, FNP    Current Facility-Administered Medications  Medication Dose Route Frequency Provider Last Rate Last Admin   albuterol (PROVENTIL) (2.5 MG/3ML) 0.083% nebulizer solution 2.5 mg  2.5 mg Nebulization Q4H PRN Zierle-Ghosh, Asia B, DO   2.5 mg at 2021-08-16 0518   feeding supplement (ENSURE ENLIVE / ENSURE PLUS) liquid 237 mL  237 mL Oral BID BM Zierle-Ghosh, Asia B, DO       heparin injection 5,000 Units  5,000 Units Subcutaneous Q8H Zierle-Ghosh, Asia B, DO   5,000 Units at 16-Aug-2021 8119   loperamide (IMODIUM) capsule 2 mg  2 mg Oral PRN Zierle-Ghosh, Asia B, DO       loperamide (IMODIUM) capsule 4 mg  4 mg Oral Once Zierle-Ghosh, Asia B, DO       magic mouthwash  15 mL Oral TID Zierle-Ghosh, Asia B, DO   15 mL at 08/12/21 2157   magnesium sulfate IVPB 4 g 100 mL  4 g Intravenous Once Johnson, Clanford L, MD       mometasone-formoterol (DULERA) 200-5 MCG/ACT inhaler 2 puff  2 puff Inhalation BID Zierle-Ghosh, Asia B, DO       morphine (PF) 2 MG/ML injection 2 mg  2 mg Intravenous Q2H PRN Zierle-Ghosh, Asia B, DO   2 mg at 28-Aug-2021 0621   ondansetron (ZOFRAN) tablet 4 mg  4 mg Oral Q6H PRN Zierle-Ghosh, Asia B, DO       Or   ondansetron (ZOFRAN) injection 4 mg  4 mg Intravenous Q6H PRN Zierle-Ghosh, Asia B, DO   4 mg at 08/12/21 2154   piperacillin-tazobactam (ZOSYN) IVPB 3.375 g  3.375 g Intravenous Q12H Johnson, Clanford L, MD       prochlorperazine (COMPAZINE) injection 10 mg  10 mg Intravenous Q6H PRN Zierle-Ghosh, Asia B, DO   10 mg at 2021-08-28 0125   traMADol (ULTRAM) tablet 50-100 mg  50-100 mg Oral Q12H PRN Zierle-Ghosh, Asia B, DO   50 mg at 08/12/21 2154    Allergies as of 08/12/2021 - Review Complete 08/12/2021  Allergen Reaction Noted    Epinephrine  10/12/2007    Family History  Problem Relation Age of Onset   Ovarian cancer Mother 3       metastatic to bowels   Diabetes Mother    Hypertension Mother    Colon polyps Mother    Huntington's disease Father        died at 5   Huntington's disease Brother    Colon cancer Maternal Grandmother    Heart attack Maternal Grandfather    Emphysema Paternal Grandmother    Rheum arthritis Paternal Grandmother    Lung cancer Paternal Grandmother    Heart attack Paternal Grandfather     Social History   Socioeconomic History   Marital status: Married    Spouse name: Not on file   Number of children: 3   Years of education: Not on file   Highest education level: Some college, no degree  Occupational History   Not on file  Tobacco Use   Smoking status: Former    Packs/day: 1.50    Years: 21.00    Total pack years: 31.50    Types: Cigarettes    Quit date: 02/17/2014    Years since quitting: 7.4   Smokeless tobacco: Never  Vaping Use   Vaping Use: Never used  Substance and Sexual Activity   Alcohol use: Not Currently    Alcohol/week: 2.0 standard drinks of alcohol    Types: 2 Glasses of wine per week    Comment: occassional 2 glasses of wine a week   Drug use: No   Sexual activity: Yes    Comment: has one sexual patner  Other Topics Concern   Not on file  Social History Narrative   Not on file   Social Determinants of Health   Financial Resource Strain: Not on file  Food Insecurity: No Food Insecurity (02/04/2021)   Hunger Vital Sign    Worried About Running Out of Food in the Last Year: Never true    Ran Out of Food in the Last Year: Never true  Transportation Needs: Not on file  Physical Activity: Not on file  Stress: Not on file  Social Connections: Not on file  Intimate Partner Violence: Not on file    Review of Systems: Gen: Denies fever, chills, cold or flu like symptoms.  CV: Denies chest pain. Admits to palpitations.  Resp: Admits to SOB  and cough.  GI: See HPI GU : Denies trouble emptying his bladder in the past.  MS: Denies joint pain. Derm: Admits to easy bruising.  Heme: See HPI  Physical Exam: Vital signs in last 24 hours: Temp:  [97.6 F (36.4 C)-98.2 F (36.8 C)] 97.6 F (36.4 C) (06/27 4010) Pulse Rate:  [74-123] 118 (06/27 0723) Resp:  [11-32] 32 (06/27 0723) BP: (90-111)/(60-74) 91/69 (06/27 0723) SpO2:  [77 %-100 %] 94 % (06/27 0723) Weight:  [48.9 kg-49 kg] 48.9 kg (06/26 2115) Last BM Date : 2021/08/26 BP 80s over 50s when I was in the room. Heart rate in the 120s.  General:   Alert,  appears uncomfortable, restless, jaundiced, tachypneic. Head:  Normocephalic and atraumatic. Eyes:  + Scleral icterus Ears:  Normal auditory acuity. Nose:  No deformity, discharge,  or lesions. Lungs:  Rhonchi anteriorly, few scattered rhonchi in left posterior lung fields, right posterior lung fields clear.  On 4 L nasal cannula Heart: Tachycardic with regular rhythm.  Heart rate in the 120s. Abdomen: Distended, taut, generalized moderate tenderness to light palpation, greatest in the suprapubic region.  Voluntary guarding without rebound.  No masses, hepatosplenomegaly or hernias noted. Hypoactive bowel sounds.    Rectal:  Deferred.  Msk:  Symmetrical without gross deformities. Normal posture. Extremities:  Without edema. Neurologic:  Alert and  oriented to person, place, situation, and time, but seems to have trouble giving history today.  Skin:  Intact without significant lesions or rashes. Psych:  Normal mood and affect.  Intake/Output from previous day: 06/26 0701 - 06/27 0700 In: 1394.3 [IV Piggyback:1394.3] Out: 125 [Urine:25; Emesis/NG output:100] Intake/Output this shift: No intake/output data recorded.  Lab Results: Recent Labs    08/26/21 1428 07/22/2021 0513  WBC 12.5* 5.1  HGB 11.7* 8.1*  HCT 34.7* 24.4*  PLT 171 141*   BMET Recent Labs    08/26/2021 1428 07/18/2021 0513  NA 130* 133*  K  3.3* 3.3*  CL 92* 97*  CO2 22 17*  GLUCOSE 101* 51*  BUN 15 18  CREATININE 3.97* 4.19*  CALCIUM 7.3* 6.7*   LFT Recent Labs    08/26/21 1428 07/27/2021 0513  PROT 6.0* 5.4*  ALBUMIN 2.2* 3.3*  AST 187* 150*  ALT 74* 40  ALKPHOS 254* 138*  BILITOT 15.6* 11.2*   PT/INR Recent Labs    08/26/2021 1428 07/19/2021 0513  LABPROT 16.3* 22.8*  INR 1.3* 2.0*   Hepatitis Panel Recent Labs    2021-08-26 1428  HEPBSAG NON REACTIVE  HCVAB NON REACTIVE  HEPAIGM NON REACTIVE  HEPBIGM NON REACTIVE   Studies/Results: DG Chest Port 1 View  Result Date: 08/14/2021 CLINICAL DATA:  Shortness of breath. EXAM: PORTABLE CHEST 1 VIEW COMPARISON:  March 24, 2019 FINDINGS: The heart size and mediastinal contours are within normal limits. Mildly decreased lung volumes are seen which is likely secondary to the degree of patient inspiration. There is no evidence of acute infiltrate, pleural effusion or pneumothorax. The visualized skeletal structures are unremarkable. IMPRESSION: No active cardiopulmonary disease. Electronically Signed   By: Aram Candela M.D.   On: 07/30/2021 03:33   CT ABDOMEN PELVIS WO CONTRAST  Result Date: 08-26-21 CLINICAL DATA:  A 46 year old male presents for evaluation of nausea vomiting and abdominal pain. EXAM: CT ABDOMEN AND PELVIS WITHOUT CONTRAST TECHNIQUE: Multidetector CT imaging of the abdomen and pelvis was performed following the standard protocol without IV contrast. RADIATION DOSE REDUCTION: This exam was performed according to the departmental dose-optimization program which includes  automated exposure control, adjustment of the mA and/or kV according to patient size and/or use of iterative reconstruction technique. COMPARISON:  Ultrasound evaluation from January 09, 2021. FINDINGS: Lower chest: No effusion. No consolidative changes. Heart size normal. Low-attenuation in cardiac chambers compatible with anemia. Pulmonary emphysema. Hepatobiliary: Severe hepatic  steatosis. Density of the liver is less than the density of blood pool. Small perihepatic ascites. The no pericholecystic stranding. Biliary tree without gross dilation but not well assessed due to the density of the liver. Pancreas: Pancreatic atrophy without signs of inflammation or gross ductal dilation. Spleen: Normal. Adrenals/Urinary Tract: Adrenal glands are normal. Mild perinephric stranding. No hydronephrosis. Urinary bladder is collapsed. No nephrolithiasis. Stomach/Bowel: The stomach is under distended. No adjacent stranding. No small bowel dilation. The appendix is normal. Mural stratification of the colon.  Colon under distended. Vascular/Lymphatic: Aortic atherosclerosis without aneurysm. There is no gastrohepatic or hepatoduodenal ligament lymphadenopathy. No retroperitoneal or mesenteric lymphadenopathy. No pelvic sidewall lymphadenopathy. Reproductive: Unremarkable by CT. Other: No pneumoperitoneum.  Small volume ascites in the pelvis. Subtle stranding in the a omentum in the lower abdomen. This is not clear whether this represents ascites omits omental fat or secondary inflammation in the lower abdomen without clear cause. Musculoskeletal: No acute bone finding. No destructive bone process. Spinal degenerative changes. No acute or destructive bone finding. Osteopenia. IMPRESSION: 1. Severe hepatic steatosis and ascites. Findings likely related to hepatic dysfunction. No current signs of portal hypertension. 2. Stranding in the omental and lower abdominal fat may relate to scattered areas of ascites though underlying inflammation is considered but without clear cause. 3. Under distended colon with suggestion of mural stratification. This could be related to mild colitis and should be correlated with any clinical signs of colitis perhaps this could explain some background peritoneal inflammation. Would also consider the possibility of SBP though the patient has no overt signs of portal hypertension  despite having clear evidence of severe hepatic steatosis and ascites. 4. Normal appendix. 5. Pulmonary emphysema and aortic atherosclerosis. Aortic Atherosclerosis (ICD10-I70.0) and Emphysema (ICD10-J43.9). Electronically Signed   By: Donzetta Kohut M.D.   On: 26-Aug-2021 17:09    Impression:  46 y.o. year old male with medical history significant of anxiety, COPD, depression, hypertension, alcohol use, RA, who presented to the ED on 6/26 with chief complaint of abdominal pain, nausea, vomiting, and diarrhea, found to have soft BP, tachycardia, tachypnea, anemia, worsening LFTs, significant hyperbilirubinemia, AKI, CT with severe hepatic steatosis and ascites, stranding of the omental and lower abdominal fat possibly secondary to ascites, under distention of the colon with suggestion of mural stratification possibly secondary to mild colitis versus possible SBP.  He received IV fluids, started on Zosyn for possible SBP, albumin infusion, admitted for further evaluation and management, GI and nephrology consulted.  Elevated LFTs/hyperbilirubinemia:  Had been recently being worked up outpatient for chronically elevated LFTs.  He has known fatty liver as well as history of alcohol abuse, but reports being abstinent for the last 18 months.  Evaluation outpatient has included Hep C ab negative, Hep B surface antibody positive consistent with immunmity. Hep B surface antigen negative. US abdomen with fatty liver in Nov 2022. IgM mildly elevated. IgA elevated, IgG normal. ANA, ASMA, AMA negative. Iron sats 85, ferritin elevated at 1351 but DNA mutation not seen for hemochromatosis. Ceruloplasmin was below 20, and urine copper was low.  Recommended liver biopsy previously though patient decided against this.  Admitted with significant worsening of T. bili to 15.6 (up from 1.6  on 07/23/21), alk phos 254, AST 187, ALT 74.  Repeat acute hepatitis panel was negative yesterday.  Alcohol level negative.  Acetaminophen level  negative.  CT without contrast with severe hepatic steatosis, small perihepatic ascites, no gross biliary dilation, small volume ascites in the pelvis.  Dr. Hyacinth Meeker in the ED was unable to perform bedside diagnostic tap yesterday given small amount of ascites.  He was empirically started on Zosyn for possible SBP and also received IV albumin.  LFTs/bili improving today with AST 150, ALT 40, alk phos 138, total bilirubin 11.2.  INR up to 2.0 though he has been on heparin.  Creatinine slightly worse today, up to 4.19.  No overt HE.  He is alert and oriented x4, but does seem to have some trouble with providing an accurate history this morning.    Etiology is unclear as it seems he is experiencing multiorgan failure.  He is hypotensive this morning and has had some worsening shortness of breath, now requiring 4 L nasal cannula.  Discussed with Dr. Laural Benes who has consulted nephrology as is consulting with critical care.  Discussed consideration for evaluating possible PE/cardiac etiology.  Will defer to Dr. Laural Benes. I am planning to recheck an ammonia, obtain abdominal ultrasound and liver Doppler, and give IV vitamin K.  Case was discussed with Dr. Levon Hedger and we will also rule out other viral etiologies. May need to consider referral to transplant center.   Abdominal pain/nausea/vomiting/diarrhea:  Generalized abdominal pain x1 week with associated nausea, couple episodes of vomiting, and apparently with diarrhea initially that has resolved.  Patient reports no bowel movement in 4 to 5 days, but history does not seem reliable today.  States he has passed gas.  Denies BRBPR or melena. Abdominal pain has worsened today, currently 9/10 in severity.  CT A/P without contrast yesterday showed some stranding of the omental and lower abdominal fat possibly secondary to ascites though underlying inflammation is considered, under distended colon with suggestion of mural stratification possibly secondary to mild colitis  versus possible SBP, normal appendix.  Stool studies were ordered, but have not been collected as he has not had a bowel movement.  He was started on empiric antibiotics (Zosyn) yesterday for possible SBP.  Unfortunately, patient reports worsening abdominal pain today.  Bowel sounds are hypoactive and he has moderate generalized TTP, greatest across lower abdomen without rebound.   Etiology unclear.  Obtaining stat abdominal x-ray to evaluate for bowel obstruction/dilation, ultrasound with liver Doppler to evaluate degree of ascites and for possible portal vein thrombosis. Pending X ray findings or if any worsening abdominal pain, have low threshold for repeat CT. Continue antibiotics for now.    Anemia:  Hemoglobin 11.7 on admission, was 10.6, 6 days ago, but hemoglobin previously within normal limits in January 2023.  Hemoglobin has declined to 8.1 this morning. INR is elevated at 2 and he is currently receiving heparin. Some report of coffee-ground emesis versus hemoptysis overnight by nursing staff.  Patient was coughing and gagging while I was at bedside and spit out a very small amount of dark red contents. No reports of melena or brbpr. He is hypotensive currently with BP in the 80s over 50s.  Unclear if bleeding this is from a gastric/esophageal source versus pulmonary.  No other obvious bleeding.  At this point, recommend stabilizing patient, give 1 unit PRBCs, IV fluids to support BP, and could consider EGD for further evaluation once stabilized. Would not suspect variceal bleeding as he has no cirrhosis  on CT and no evidence of portal hypertension on CT. Could have Mallory-Weiss tear, esophagitis, gastritis, PUD vs pulmonary etiology. Will start also empiric PPI BID for now.     AKI/kidney failure:  Unclear etiology. Possible hepatorenal vs other as it appears he is having multiorgan failure. Little to no urinary output. Nephrology has been consulted. Cr 3.97 on admission, up to 4.19 today.  No  history of kidney disease previously.  Plan: Fractionate bilirubin, ammonia, HSV antibodies and PCR, CMV antibodies, EBV antibodies, hepatitis B DNA, hepatitis C RNA US abdomen with liver doppler DG abdomen STAT  Low threshold for repeat CT scan pending x ray findings or if worsening abdominal pain.  IV vitamin K 10 mg Stop heparin 1 unit PRBCs Monitor for overt GI bleeding Start IV PPI BID H/H every 6 hours. IV fluids per hospitalist to support/correct hypotension.  Agree with nephrology consult.  Agree with critical care consult per hospitalist.  Monitor LFTs, INR daily.  Monitor for hepatic encephalopathy.  May need to consider referral to transplant center, will discuss further with Dr. Levon Hedger.     LOS: 1 day    Aug 19, 2021, 8:01 AM   Ermalinda Memos, [facility]

## 2021-08-17 NOTE — ED Provider Notes (Signed)
On June 27 a code was called in the ICU.  I responded to the code and appropriate ACLS and CPR was being performed on Mr. Ethan Norton.  He was apneic and pulseless.  Patient was given several rounds of epi and did not respond.  Dr. Wynetta Emery the hospitalist spoke with the family and it was decided to cease resuscitative efforts.   Milton Ferguson, MD 08/15/21 [phone]

## 2021-08-17 NOTE — Consult Note (Signed)
[facility] Renal Consultation Note  Requesting MD: Laural Benes Indication for Consultation: AKI  HPI:  Ethan Norton is a 46 y.o. male with past medical history significant for HTN on ARB, COPD, reported depression/anxiety and questionable history of ETOH abuse and liver dysfunction. There is a crt in the system from 07/03/21 that was 0.78.  He presented to the ER yesterday afternoon with complaints of abdominal pain/N/V/D-  CT done without contrast showed hepatosteatosis, min ascites, mild colitis vs SBP-  labs showed crt of 3.97.   He has been hypotensive.  He is restless in the bed-  unable to provide history.  There has been no urine recorded-  has a foley-  he reports decreased UOP as OP-  was taking his benicar and also taking ibuprofen.  Crt up this AM to 4.19  Creatinine, Ser  Date/Time Value Ref Range Status  Aug 27, 2021 05:13 AM 4.19 (H) 0.61 - 1.24 mg/dL Final  27/25/3664 40:34 PM 3.97 (H) 0.61 - 1.24 mg/dL Final  74/25/9563 87:56 AM 0.78 0.76 - 1.27 mg/dL Final  43/32/9518 84:16 AM 0.68 (L) 0.76 - 1.27 mg/dL Final  60/63/0160 10:93 AM 0.94 0.76 - 1.27 mg/dL Final  23/55/7322 02:54 PM 0.78 0.76 - 1.27 mg/dL Final  27/07/2374 28:31 PM 1.05 0.61 - 1.24 mg/dL Final  51/76/1607 37:10 PM 0.73 0.40 - 1.50 mg/dL Final     PMHx:   Past Medical History:  Diagnosis Date   Anxiety    COPD (chronic obstructive pulmonary disease) (HCC)    Depression    Hypertension    Neuropathic pain    RA (rheumatoid arthritis) (HCC)     Past Surgical History:  Procedure Laterality Date   WISDOM TOOTH EXTRACTION      Family Hx:  Family History  Problem Relation Age of Onset   Ovarian cancer Mother 70       metastatic to bowels   Diabetes Mother    Hypertension Mother    Colon polyps Mother    Huntington's disease Father        died at 88   Huntington's disease Brother    Colon cancer Maternal Grandmother    Heart attack Maternal Grandfather    Emphysema Paternal  Grandmother    Rheum arthritis Paternal Grandmother    Lung cancer Paternal Grandmother    Heart attack Paternal Grandfather     Social History:  reports that he quit smoking about 7 years ago. His smoking use included cigarettes. He has a 31.50 pack-year smoking history. He has never used smokeless tobacco. He reports that he does not currently use alcohol after a past usage of about 2.0 standard drinks of alcohol per week. He reports that he does not use drugs.  Allergies:  Allergies  Allergen Reactions   Epinephrine     Uncontrollable shaking with epinephrine in gums for dental work    Medications: Prior to Admission medications   Medication Sig Start Date End Date Taking? Authorizing Provider  albuterol (PROVENTIL) (2.5 MG/3ML) 0.083% nebulizer solution Take 3 mLs (2.5 mg total) by nebulization every 4 (four) hours as needed for wheezing or shortness of breath. 03/25/18  Yes Nyoka Cowden, MD  albuterol (VENTOLIN HFA) 108 (90 Base) MCG/ACT inhaler INHALE 2 PUFFS INTO THE LUNGS EVERY 6 HOURS AS NEEDED FOR WHEEZING OR SHORTNESS OF BREATH 05/17/21  Yes Nyoka Cowden, MD  azelastine (ASTELIN) 0.1 % nasal spray Place 2 sprays into both nostrils at bedtime. Use in each nostril as directed 03/27/21  08/28/2021 Yes Alfonse Spruce, MD  budesonide-formoterol (SYMBICORT) 160-4.5 MCG/ACT inhaler INHALE 2 PUFFS INTO THE LUNGS FIRST THING IN THE MORNING THEN INHALE 2 PUFFS BY MOUTH 12 HOURS LATER Patient taking differently: Inhale 2 puffs into the lungs every 12 (twelve) hours. 03/18/21  Yes Nyoka Cowden, MD  fluticasone (FLONASE) 50 MCG/ACT nasal spray Place 2 sprays into both nostrils at bedtime. 05/31/21 2021/08/28 Yes Alfonse Spruce, MD  Homeopathic Products ([facility]) GEL Apply topically.   Yes [provider]  Ibuprofen 200 MG CAPS Take 200-400 mg by mouth every 8 (eight) hours as needed.   Yes [provider]  levocetirizine (XYZAL) 5 MG tablet Take 1 tablet (5 mg  total) by mouth 2 (two) times daily as needed (Can take an extra dose during flare ups.). Patient taking differently: Take 5 mg by mouth daily as needed (Can take an extra dose during flare ups.). 05/31/21  Yes Alfonse Spruce, MD  Loperamide HCl (IMODIUM PO) Take by mouth. As needed   Yes [provider]  Multiple Vitamin (MULTIVITAMIN) tablet Take 1 tablet by mouth every evening.    Yes [provider]  omeprazole (PRILOSEC) 20 MG capsule Take 20 mg by mouth daily.   Yes [provider]  potassium chloride 20 MEQ/15ML (10%) SOLN TAKE 15 MLS BY MOUTH EVERY DAY. 07/17/21  Yes Paseda, Baird Kay, FNP  telmisartan (MICARDIS) 40 MG tablet Take 1 tablet (40 mg total) by mouth daily. 11/07/20  Yes Nyoka Cowden, MD  Calcium Carb-Cholecalciferol (CALTRATE 600+D3) 600-20 MG-MCG TABS Take 600 mg by mouth 2 (two) times daily. 07/06/21   Donell Beers, FNP    I have reviewed the patient's current medications.  Labs:  Results for orders placed or performed during the hospital encounter of 08-28-2021 (from the past 48 hour(s))  Lipase, blood     Status: None   Collection Time: August 28, 2021  2:28 PM  Result Value Ref Range   Lipase 26 11 - 51 U/L    Comment: Performed at [facility], [address] [place], [place] [address]  Comprehensive metabolic panel     Status: Abnormal   Collection Time: 28-Aug-2021  2:28 PM  Result Value Ref Range   Sodium 130 (L) 135 - 145 mmol/L   Potassium 3.3 (L) 3.5 - 5.1 mmol/L   Chloride 92 (L) 98 - 111 mmol/L   CO2 22 22 - 32 mmol/L   Glucose, Bld 101 (H) 70 - 99 mg/dL    Comment: Glucose reference range applies only to samples taken after fasting for at least 8 hours.   BUN 15 6 - 20 mg/dL   Creatinine, Ser 6.04 (H) 0.61 - 1.24 mg/dL   Calcium 7.3 (L) 8.9 - 10.3 mg/dL   Total Protein 6.0 (L) 6.5 - 8.1 g/dL   Albumin 2.2 (L) 3.5 - 5.0 g/dL   AST 540 (H) 15 - 41 U/L   ALT 74 (H) 0 - 44 U/L   Alkaline Phosphatase 254 (H) 38 - 126 U/L    Total Bilirubin 15.6 (H) 0.3 - 1.2 mg/dL   GFR, Estimated 18 (L) >60 mL/min    Comment: (NOTE) Calculated using the CKD-EPI Creatinine Equation (2021)    Anion gap 16 (H) 5 - 15    Comment: Performed at [facility], [address], [place], [place] [address]  CBC     Status: Abnormal   Collection Time: 28-Aug-2021  2:28 PM  Result Value Ref Range  WBC 12.5 (H) 4.0 - 10.5 K/uL   RBC 2.94 (L) 4.22 - 5.81 MIL/uL   Hemoglobin 11.7 (L) 13.0 - 17.0 g/dL   HCT 29.5 (L) 62.1 - 30.8 %   MCV 118.0 (H) 80.0 - 100.0 fL   MCH 39.8 (H) 26.0 - 34.0 pg   MCHC 33.7 30.0 - 36.0 g/dL   RDW 65.7 84.6 - 96.2 %   Platelets 171 150 - 400 K/uL   nRBC 0.0 0.0 - 0.2 %    Comment: Performed at [facility], [address], [place], [place] [address]  Rapid HIV screen (HIV 1/2 Ab+Ag)     Status: None   Collection Time: 08/12/21  2:28 PM  Result Value Ref Range   HIV-1 P24 Antigen - HIV24 NON REACTIVE NON REACTIVE    Comment: (NOTE) Detection of p24 may be inhibited by biotin in the sample, causing false negative results in acute infection.    HIV 1/2 Antibodies NON REACTIVE NON REACTIVE   Interpretation (HIV Ag Ab)      A non reactive test result means that HIV 1 or HIV 2 antibodies and HIV 1 p24 antigen were not detected in the specimen.    Comment: Performed at [facility], [address], [place], [place] [address]  Hepatitis panel, acute     Status: None   Collection Time: 08/12/21  2:28 PM  Result Value Ref Range   Hepatitis B Surface Ag NON REACTIVE NON REACTIVE   HCV Ab NON REACTIVE NON REACTIVE    Comment: (NOTE) Nonreactive HCV antibody screen is consistent with no HCV infections,  unless recent infection is suspected or other evidence exists to indicate HCV infection.     Hep A IgM NON REACTIVE NON REACTIVE   Hep B C IgM NON REACTIVE NON REACTIVE    Comment: Performed at [facility], [address], [place], [place] [address]  Protime-INR     Status: Abnormal   Collection Time:  08/12/21  2:28 PM  Result Value Ref Range   Prothrombin Time 16.3 (H) 11.4 - 15.2 seconds   INR 1.3 (H) 0.8 - 1.2    Comment: (NOTE) INR goal varies based on device and disease states. Performed at [facility], [address], [place], [place] [address]   Acetaminophen level     Status: Abnormal   Collection Time: 08/12/21  5:11 PM  Result Value Ref Range   Acetaminophen (Tylenol), Serum <10 (L) 10 - 30 ug/mL    Comment: (NOTE) Therapeutic concentrations vary significantly. A range of 10-30 ug/mL  may be an effective concentration for many patients. However, some  are best treated at concentrations outside of this range. Acetaminophen concentrations >150 ug/mL at 4 hours after ingestion  and >50 ug/mL at 12 hours after ingestion are often associated with  toxic reactions.  Performed at [facility], [address], [place], [place] [address]   Salicylate level     Status: None   Collection Time: 08/12/21  5:11 PM  Result Value Ref Range   Salicylate Lvl 10.6 7.0 - 30.0 mg/dL    Comment: Performed at [facility], [address] [place], [place] [address]  Ethanol     Status: None   Collection Time: 08/12/21  5:11 PM  Result Value Ref Range   Alcohol, Ethyl (B) <10 <10 mg/dL    Comment: (NOTE) Lowest detectable limit for serum alcohol is 10 mg/dL.  For medical purposes only. Performed at [facility], [address], [place], [place] [address]   Culture, blood (Routine X 2) w Reflex to ID Panel     Status: None (Preliminary result)   Collection Time: 2021-09-04  8:57 PM   Specimen: BLOOD RIGHT ARM  Result Value Ref Range   Specimen Description BLOOD RIGHT ARM    Special Requests      BOTTLES DRAWN AEROBIC ONLY Blood Culture adequate volume   Culture      NO GROWTH < 12 HOURS Performed at [facility], [address], [place], [place] [address]    Report Status PENDING   Culture, blood (Routine X 2) w Reflex to ID Panel     Status: None (Preliminary result)   Collection  Time: September 04, 2021  8:57 PM   Specimen: Right Antecubital; Blood  Result Value Ref Range   Specimen Description RIGHT ANTECUBITAL    Special Requests      BOTTLES DRAWN AEROBIC ONLY Blood Culture adequate volume   Culture      NO GROWTH < 12 HOURS Performed at [facility], [address] [place], [place] [address]    Report Status PENDING   Occult bld gastric/duodenum (cup to lab)     Status: Abnormal   Collection Time: 08/01/2021  1:30 AM  Result Value Ref Range   pH, Gastric 4    Occult Blood, Gastric POSITIVE (A) NEGATIVE    Comment: Performed at [facility], [address] [place], [place] [address]  Blood gas, arterial     Status: Abnormal   Collection Time: 07/22/2021  3:45 AM  Result Value Ref Range   FIO2 36.00 %   pH, Arterial 7.37 7.35 - 7.45   pCO2 arterial 34 32 - 48 mmHg   pO2, Arterial 86 83 - 108 mmHg   Bicarbonate 19.7 (L) 20.0 - 28.0 mmol/L   Acid-base deficit 4.8 (H) 0.0 - 2.0 mmol/L   O2 Saturation 99 %   Patient temperature 37.0    Collection site LEFT RADIAL    Drawn by [id number]    Allens test (pass/fail) PASS PASS    Comment: Performed at [facility], [address], [place], [place] [address]  Comprehensive metabolic panel     Status: Abnormal   Collection Time: 07/25/2021  5:13 AM  Result Value Ref Range   Sodium 133 (L) 135 - 145 mmol/L   Potassium 3.3 (L) 3.5 - 5.1 mmol/L   Chloride 97 (L) 98 - 111 mmol/L   CO2 17 (L) 22 - 32 mmol/L   Glucose, Bld 51 (L) 70 - 99 mg/dL    Comment: Glucose reference range applies only to samples taken after fasting for at least 8 hours.   BUN 18 6 - 20 mg/dL   Creatinine, Ser 6.38 (H) 0.61 - 1.24 mg/dL   Calcium 6.7 (L) 8.9 - 10.3 mg/dL   Total Protein 5.4 (L) 6.5 - 8.1 g/dL   Albumin 3.3 (L) 3.5 - 5.0 g/dL   AST 756 (H) 15 - 41 U/L   ALT 40 0 - 44 U/L   Alkaline Phosphatase 138 (H) 38 - 126 U/L   Total Bilirubin 11.2 (H) 0.3 - 1.2 mg/dL    Comment: DELTA CHECK NOTED   GFR, Estimated 17 (L) >60 mL/min    Comment:  (NOTE) Calculated using the CKD-EPI Creatinine Equation (2021)    Anion gap 19 (H) 5 - 15    Comment: Performed at [facility], [address] [place], [place] [address]  Magnesium     Status: Abnormal  Collection Time: 08/08/2021  5:13 AM  Result Value Ref Range   Magnesium 1.2 (L) 1.7 - 2.4 mg/dL    Comment: Performed at [facility], [address], [place], [place] [address]  CBC with Differential/Platelet     Status: Abnormal   Collection Time: 07/22/2021  5:13 AM  Result Value Ref Range   WBC 5.1 4.0 - 10.5 K/uL   RBC 2.00 (L) 4.22 - 5.81 MIL/uL   Hemoglobin 8.1 (L) 13.0 - 17.0 g/dL    Comment: DELTA CHECK NOTED   HCT 24.4 (L) 39.0 - 52.0 %   MCV 122.0 (H) 80.0 - 100.0 fL   MCH 40.5 (H) 26.0 - 34.0 pg   MCHC 33.2 30.0 - 36.0 g/dL   RDW 13.0 86.5 - 78.4 %   Platelets 141 (L) 150 - 400 K/uL   Neutrophils Relative % 83 %   Neutro Abs 4.2 1.7 - 7.7 K/uL   Lymphocytes Relative 14 %   Lymphs Abs 0.7 0.7 - 4.0 K/uL   Monocytes Relative 3 %   Monocytes Absolute 0.2 0.1 - 1.0 K/uL   Eosinophils Relative 0 %   Eosinophils Absolute 0.0 0.0 - 0.5 K/uL   Basophils Relative 0 %   Basophils Absolute 0.0 0.0 - 0.1 K/uL   WBC Morphology REACTIVE LYMPHS PRESENT    RBC Morphology STOMATOCYTES     Comment: POLYCHROMASIA PRESENT Performed at [facility], [address] [place], [place] [address]   Protime-INR     Status: Abnormal   Collection Time: 07/19/2021  5:13 AM  Result Value Ref Range   Prothrombin Time 22.8 (H) 11.4 - 15.2 seconds   INR 2.0 (H) 0.8 - 1.2    Comment: (NOTE) INR goal varies based on device and disease states. Performed at [facility], [address], [place], [place] [address]   Glucose, capillary     Status: Abnormal   Collection Time: 08/07/2021  8:11 AM  Result Value Ref Range   Glucose-Capillary 51 (L) 70 - 99 mg/dL    Comment: Glucose reference range applies only to samples taken after fasting for at least 8 hours.     ROS:  Review of systems not  obtained due to patient factors.  But has abdominal pain -  writhing in bed  Physical Exam: Vitals:   07/24/2021 0818 07/25/2021 0826  BP: 121/76   Pulse: (!) 126 (!) 118  Resp: (!) 28 (!) 31  Temp:    SpO2: 92% 94%     General: thin-  tachy-  writhing in bed -  wanting to sit up  HEENT: PERRLA, EOMI, mucous membranes dry Neck: no JVD Heart: tachy Lungs: mostly clear-  poor resp effort Abdomen: distended-  extremely painful-  seems like guarding Extremities: no peripheral edema Skin: warm and dry Neuro: some delerium   Assessment/Plan: 46 year old WM with prior HTN on ARB and possible liver dysfunction -  now presenting with some sort of abdominal pathology-  not clear but also AKI 1.Renal- AKI first of all in the setting of hypotension on an ARB-  he seems dry given exam and vitals- has been given some fluid but I am increasing-  give another NS bolus and continue as needed-  holding micardis.  Unclear if this could be consistent with HRS.  Giving volume then would give pressors if BP does not increase.  Could his hypotension be from sepsis-  most likely an intraabdominal source-  could colitis be ischemic bowel ? On zosyn.  Currently anuric which is concerning-  but no absolute indications for RRT but could get there fast if this does not turn around.  If needed RRT would likely need to be in the form of CRRT 2. Hypertension/volume  - seems dry-  giving fluid resuscitation  3. Metabolic acidosis -  progressing with AKI-  but given positive belly exam will check lactate 4. Anemia  - gastric occult was positive-  hgb dropped a couple of grams overnight-  likely GIB and volume expansion-  INR has risen 5. Dispo-  would benefit from CCM and possibly surgery consults-  I might favor a transfer to [facility] for further management    Cecille Aver 07/18/2021, 9:17 AM

## 2021-08-17 NOTE — Progress Notes (Signed)
Lactic acid 6.4  Dr. Laural Benes made aware. One bolus already being given. Another bolus ordered. Dr. Laural Benes to place order for a repeat lactic to be drawn.

## 2021-08-17 NOTE — Progress Notes (Signed)
No urinary output after 40mg  of Lasix. Bladder scan showed 380. In and out attempted and was unsuccessful at advancement. Indwelling attempted and was successful. Depsite 380 form bladder scan output is only 25. MD Zierle informed and advised to leave indwelling. Ethan Norton

## 2021-08-17 DEATH — deceased

## 2021-11-11 ENCOUNTER — Ambulatory Visit: Payer: Federal, State, Local not specified - PPO | Admitting: Internal Medicine

## 2021-11-29 ENCOUNTER — Ambulatory Visit: Payer: Federal, State, Local not specified - PPO | Admitting: Allergy & Immunology

## 2022-01-02 ENCOUNTER — Encounter: Payer: Federal, State, Local not specified - PPO | Admitting: Nurse Practitioner

## 2022-04-12 IMAGING — US US ABDOMEN COMPLETE
1 series · 14 of 25 positions shown · non-contrast
Comparison: None.

CLINICAL DATA: Chronic abdominal pain

EXAM:
ABDOMEN ULTRASOUND COMPLETE

[Series 1: us abdomen complete · 14 of 178 slices shown]
[im 1/178]
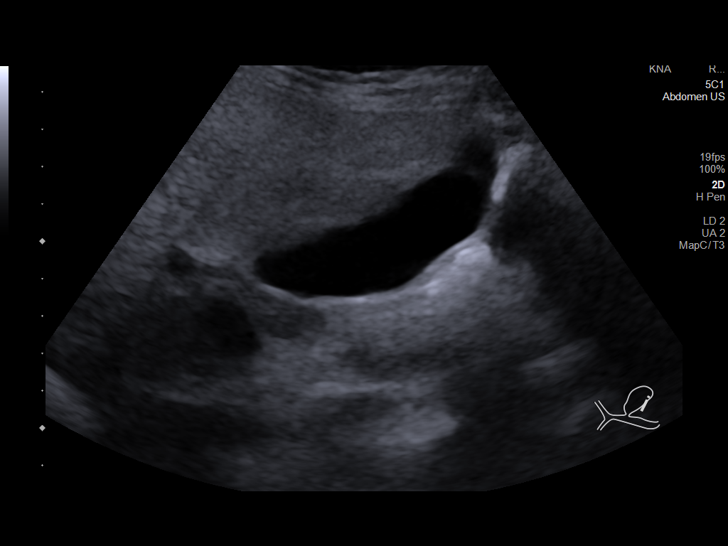
[im 15/178]
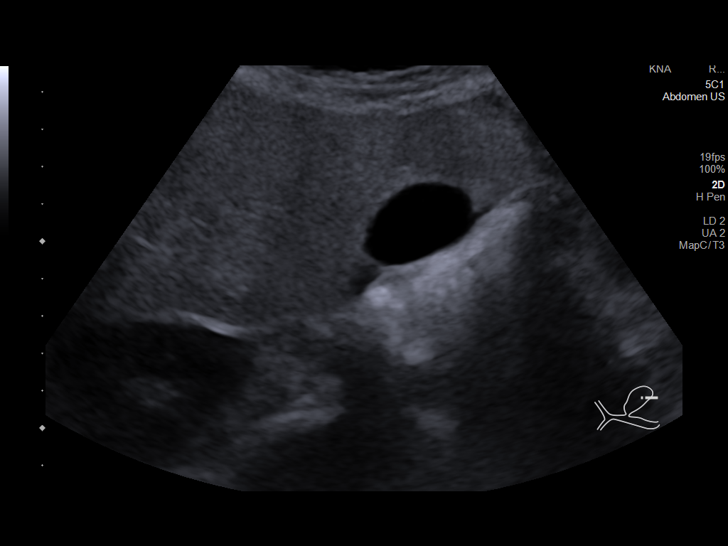
[im 30/178]
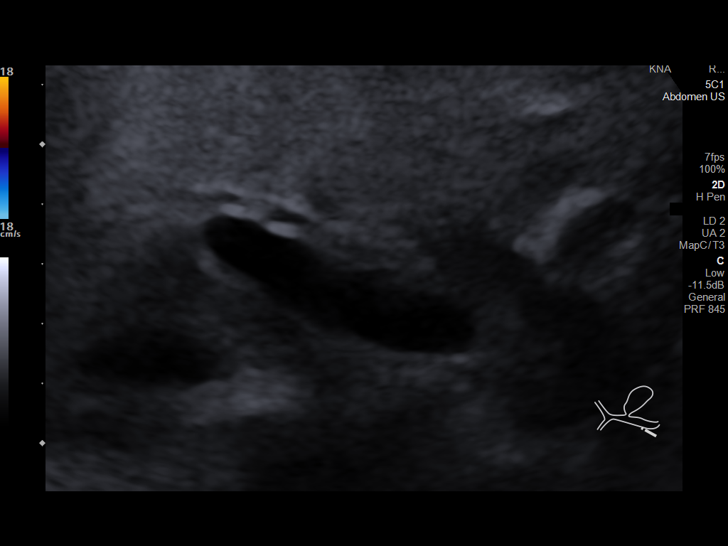
[im 45/178]
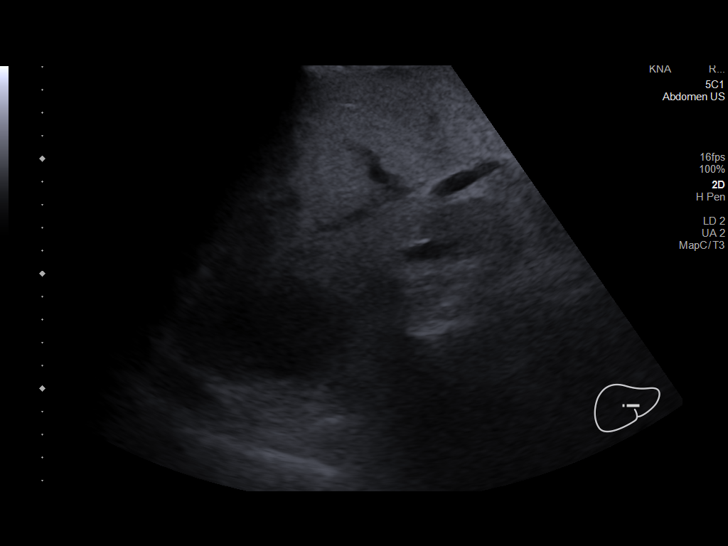
[im 60/178]
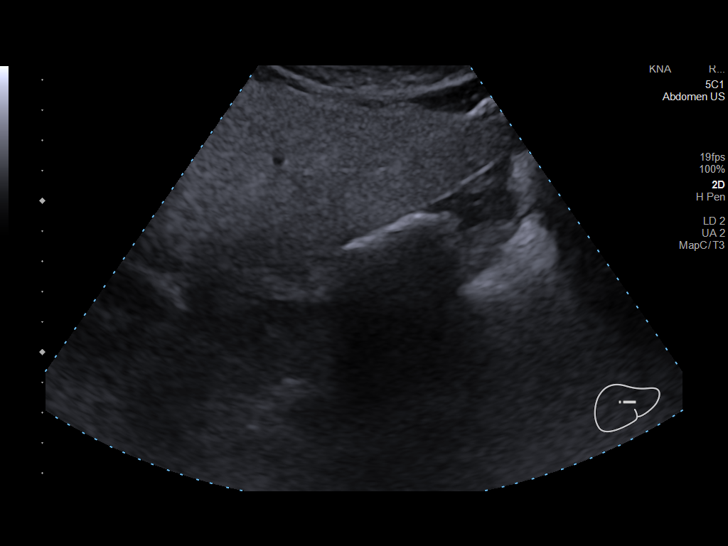
[im 67/178]
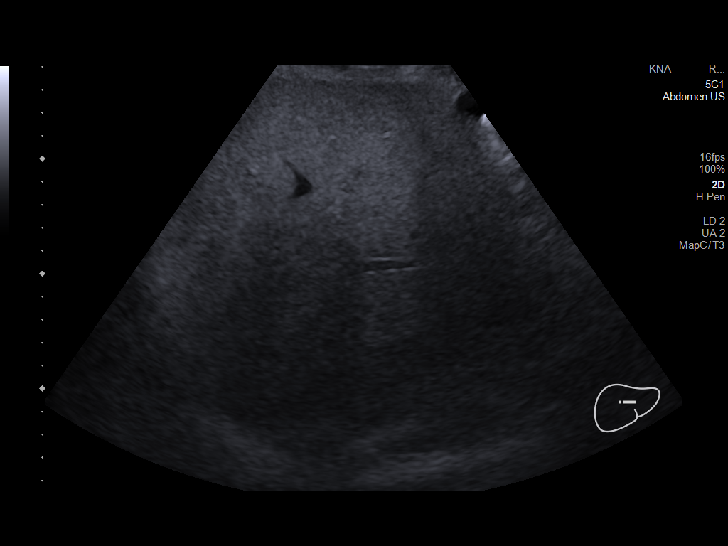
[im 82/178]
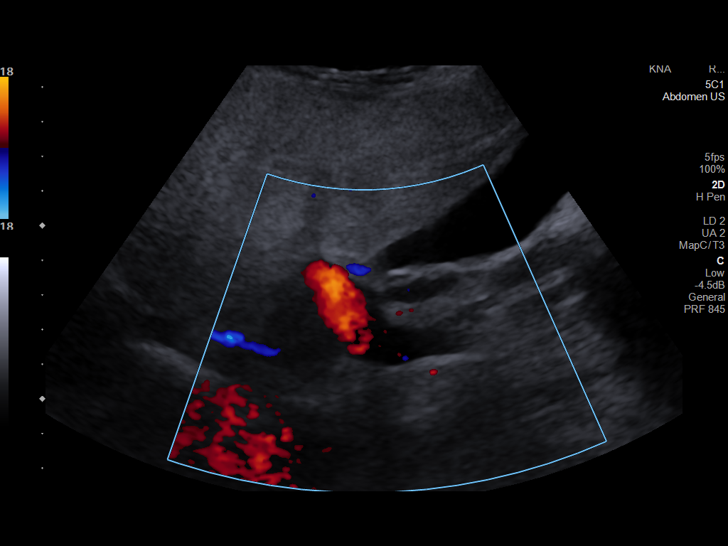
[im 96/178]
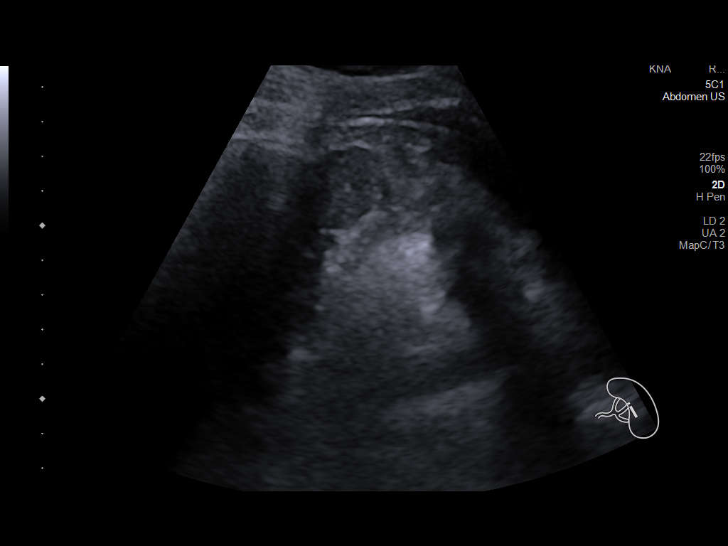
[im 111/178]
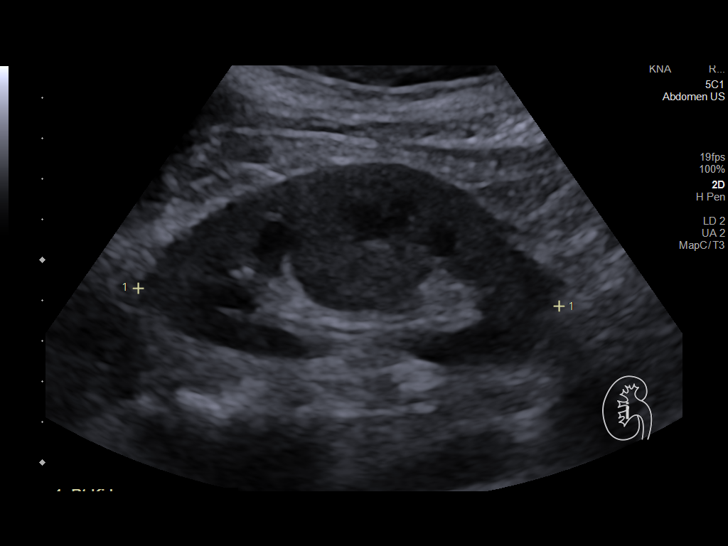
[im 119/178]
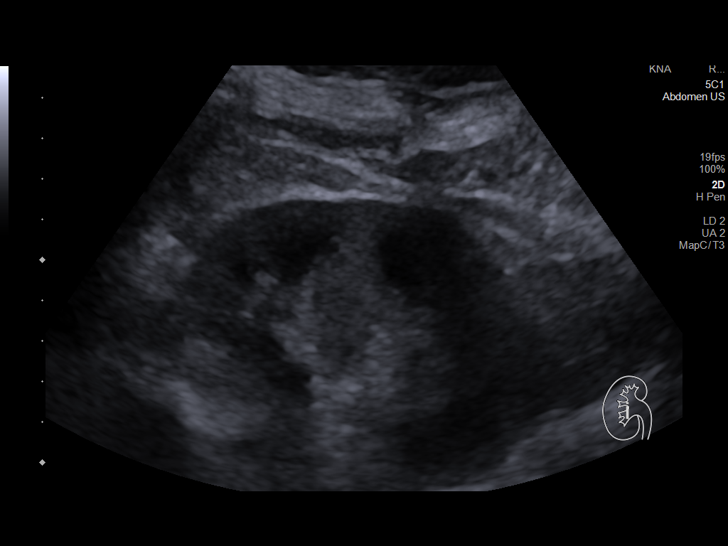
[im 133/178]
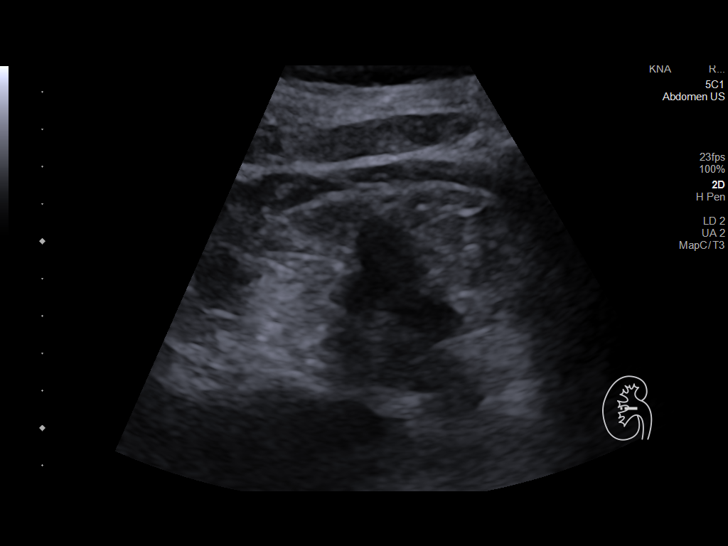
[im 148/178]
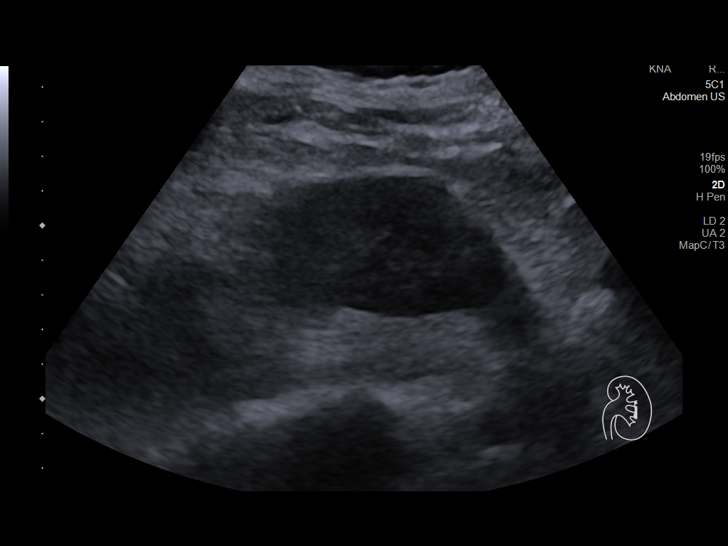
[im 163/178]
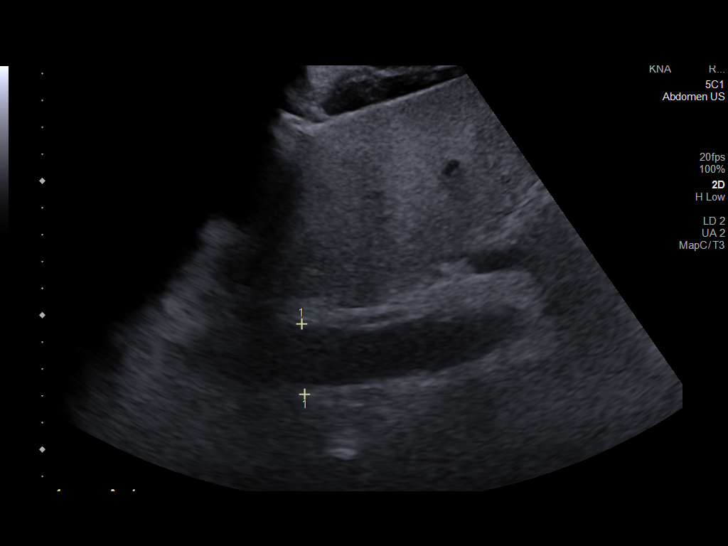
[im 178/178]
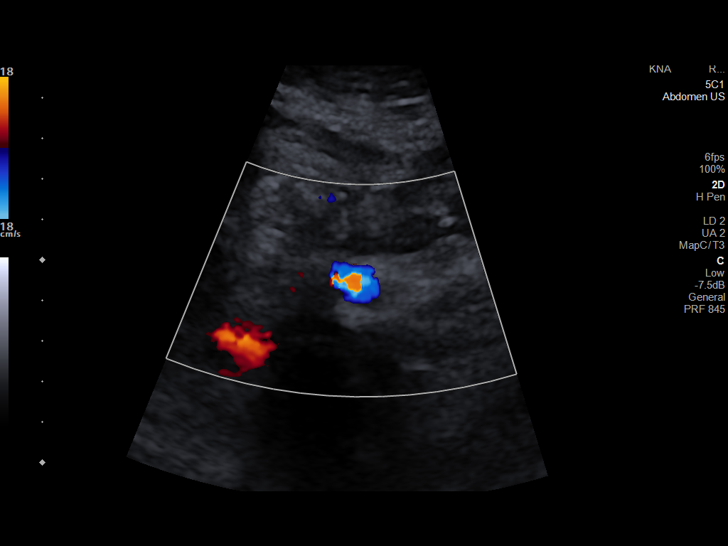

[14 of 25 positions shown; findings below may reference images not displayed]

FINDINGS: Gallbladder: No gallstones or wall thickening visualized. No
sonographic Murphy sign noted by sonographer.

Common bile duct: Diameter: Normal caliber, 2 mm

Liver: Increased echotexture compatible with fatty infiltration. No
focal abnormality or biliary ductal dilatation. Portal vein is
patent on color Doppler imaging with normal direction of blood flow
towards the liver.

IVC: No abnormality visualized.

Pancreas: Visualized portion unremarkable.

Spleen: Size and appearance within normal limits.

Right Kidney: Length: 10.4 cm. Echogenicity within normal limits. No
mass or hydronephrosis visualized.

Left Kidney: Length: 9.0 cm. Echogenicity within normal limits. No
mass or hydronephrosis visualized.

Abdominal aorta: No aneurysm visualized.

Other findings: None.
IMPRESSION: Hepatic steatosis.

No acute findings.
